# Patient Record
Sex: Female | Born: 1995 | Race: Black or African American | Hispanic: No | Marital: Single | Smoking: Never smoker
Health system: Southern US, Community
[De-identification: ages and names within clinical notes are randomized; demographics above are authoritative.]

## PROBLEM LIST (undated history)

## (undated) SURGERY — EXCISION, CHALAZION
Anesthesia: LOCAL | Laterality: Left

---
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
## 2016-05-30 ENCOUNTER — Emergency Department (HOSPITAL_COMMUNITY)
Admission: EM | Admit: 2016-05-30 | Discharge: 2016-05-30 | Disposition: A | Discharge status: Home / Self Care | Payer: Medicaid Other | Attending: Emergency Medicine | Admitting: Emergency Medicine

## 2016-05-30 ENCOUNTER — Encounter (HOSPITAL_COMMUNITY): Payer: Self-pay | Admitting: *Deleted

## 2016-05-30 ENCOUNTER — Emergency Department (HOSPITAL_COMMUNITY): Payer: Medicaid Other

## 2016-05-30 DIAGNOSIS — Y929 Unspecified place or not applicable: Secondary | ICD-10-CM | POA: Insufficient documentation

## 2016-05-30 DIAGNOSIS — Y9341 Activity, dancing: Secondary | ICD-10-CM | POA: Insufficient documentation

## 2016-05-30 DIAGNOSIS — M25562 Pain in left knee: Secondary | ICD-10-CM | POA: Insufficient documentation

## 2016-05-30 DIAGNOSIS — X501XXA Overexertion from prolonged static or awkward postures, initial encounter: Secondary | ICD-10-CM | POA: Insufficient documentation

## 2016-05-30 DIAGNOSIS — Y999 Unspecified external cause status: Secondary | ICD-10-CM | POA: Insufficient documentation

## 2016-05-30 MED ORDER — NAPROXEN 500 MG PO TABS: 500.0000 mg | ORAL_TABLET | Freq: Two times a day (BID) | ORAL | 0 refills | Status: DC

## 2016-05-30 NOTE — ED Provider Notes (Signed)
[facility]-EMERGENCY DEPT Provider Note   CSN: [id number] Arrival date & time: 05/30/16  1919   By signing my name below, I, Clarisse Gouge, attest that this documentation has been prepared under the direction and in the presence of Sharilyn Sites, PA-C. Marland Kitchen Electronically signed, Clarisse Gouge, ED Scribe. 05/30/16. 8:28 PM.   History   Chief Complaint Chief Complaint  Patient presents with  . Knee Pain   The history is provided by the patient, medical records and a parent. No language interpreter was used.    Rebecca Friedman is a 21 y.o. female no pertinent PMHx on file, transported by her mother to the Emergency Department with concern for acute onset, severe L knee pain s/p twisting it while dancing today. No fall, head trauma or LOC noted. Associated L knee swelling noted. No other modifying factors noted. She describes 10/10 pain around the knee worsened with extension and weight bearing. No h/o similar symptoms noted. Pt ambulatory with crutches. No other injuries, medication allergies or any other complaints noted at this time.   History reviewed. No pertinent past medical history.  There are no active problems to display for this patient.   Past Surgical History:  Procedure Laterality Date  . CHALAZION EXCISION Left 08/15/2012   Procedure: EXCISION CHALAZION UPPER LID LEFT EYE;  Surgeon: Vita Erm., MD;  Location: [facility];  Service: Ophthalmology;  Laterality: Left;    OB History    No data available       Home Medications    Prior to Admission medications   Not on File    Family History No family history on file.  Social History Social History  Substance Use Topics  . Smoking status: Never Smoker  . Smokeless tobacco: Never Used  . Alcohol use No     Allergies   Patient has no known allergies.   Review of Systems Review of Systems  Musculoskeletal: Positive for arthralgias, gait problem and joint swelling.  All other systems  reviewed and are negative.    Physical Exam Updated Vital Signs BP 113/74 (BP Location: Right Arm)   Pulse 78   Temp 98.8 F (37.1 C) (Oral)   Resp 20   Ht  (1.702 m)   Wt 148 lb (67.1 kg)   LMP 05/28/2016   SpO2 100%   BMI 23.18 kg/m   Physical Exam  Constitutional: She is oriented to person, place, and time. She appears well-developed and well-nourished.  HENT:  Head: Normocephalic and atraumatic.  Mouth/Throat: Oropharynx is clear and moist.  Eyes: Conjunctivae and EOM are normal. Pupils are equal, round, and reactive to light.  Neck: Normal range of motion.  Cardiovascular: Normal rate, regular rhythm and normal heart sounds.   Pulmonary/Chest: Effort normal and breath sounds normal.  Abdominal: Soft. Bowel sounds are normal.  Musculoskeletal: Normal range of motion.  Left knee with mild swelling along the medial joint line; locally tender; pain noted with flexion and when valgus stress applied; DP pulse intact; normal sensation throughout leg; no overlying skin changes  Neurological: She is alert and oriented to person, place, and time.  Skin: Skin is warm and dry.  Psychiatric: She has a normal mood and affect.  Nursing note and vitals reviewed.    ED Treatments / Results  DIAGNOSTIC STUDIES: Oxygen Saturation is 100% on RA, NL by my interpretation.    COORDINATION OF CARE: 8:26 PM-Discussed next steps with pt. Pt verbalized understanding and is agreeable with the plan. Will  order brace and prepare for d/c with F/U instructions with orthopedic specialist. Pt prepared for d/c, advised of symptomatic care at home and return precautions.    Labs (all labs ordered are listed, but only abnormal results are displayed) Labs Reviewed - No data to display  EKG  EKG Interpretation None       Radiology Dg Knee Complete 4 Views Left  Result Date: 05/30/2016 CLINICAL DATA:  Left knee pain after twisting injury. EXAM: LEFT KNEE - COMPLETE 4+ VIEW COMPARISON:   None. FINDINGS: No evidence of fracture, dislocation, or joint effusion. No evidence of arthropathy. Mild enthesopathy along the posterior aspect of the distal femoral diaphysis. Soft tissues are unremarkable. IMPRESSION: No acute osseous abnormality. Electronically Signed   By: Tollie Eth M.D.   On: 05/30/2016 20:13    Procedures Procedures (including critical care time)  Medications Ordered in ED Medications - No data to display   Initial Impression / Assessment and Plan / ED Course  I have reviewed the triage vital signs and the nursing notes.  Pertinent labs & imaging results that were available during my care of the patient were reviewed by me and considered in my medical decision making (see chart for details).  21 year old female here with left knee pain after she twisted it dancing today.  No acute deformity noted on exam, does have some swelling and tenderness along the medial joint line. She has noted pain with extension and when valgus stress was applied to the knee. Leg remains neurovascularly intact. X-ray obtained and is negative for acute findings.  Patient placed in knee sleeve. Continue using crutches for now and progress back to full weight bearing as tolerated.  Referred to orthopedics if ongoing issues.  RICE routine encouraged.  Rx naprosyn.  Discussed plan with patient, she acknowledged understanding and agreed with plan of care.  Return precautions given for new or worsening symptoms.  Final Clinical Impressions(s) / ED Diagnoses   Final diagnoses:  Acute pain of left knee    New Prescriptions Discharge Medication List as of 05/30/2016  8:44 PM    START taking these medications   Details  naproxen (NAPROSYN) 500 MG tablet Take 1 tablet (500 mg total) by mouth 2 (two) times daily with a meal., Starting Sun 05/30/2016, Print       I personally performed the services described in this documentation, which was scribed in my presence. The recorded information has been  reviewed and is accurate.   Garlon Hatchet, PA-C 05/30/16 2120    Rolland Porter, MD 06/12/16 [phone]

## 2016-05-30 NOTE — Discharge Instructions (Signed)
Take the prescribed medication as directed.  Recommend to ice and elevate knee at home to help with pain/swelling. Follow-up with Dr. Aundria Rud if you continue having ongoing issues. Return to the ED for new or worsening symptoms.

## 2016-05-30 NOTE — ED Triage Notes (Signed)
Pt arrives on crutches, states she twisted her left knee while dancing today. No meds PTA.

## 2016-05-30 NOTE — ED Notes (Signed)
Patient transported to X-ray 

## 2018-02-15 ENCOUNTER — Telehealth: Payer: Self-pay | Admitting: Family Medicine

## 2018-02-15 NOTE — Telephone Encounter (Signed)
Patient called in stated she confirmed pregnancy with a physician however no record on file. Informed the customer if she would show our clinic proof of documentation we can proceed with scheduling the appt. Stated she would come in Thursday.

## 2018-04-04 ENCOUNTER — Encounter: Payer: Self-pay | Admitting: Student

## 2018-08-01 ENCOUNTER — Emergency Department (HOSPITAL_COMMUNITY)
Admission: EM | Admit: 2018-08-01 | Discharge: 2018-08-01 | Disposition: A | Discharge status: Home / Self Care | Payer: No Typology Code available for payment source | Attending: Emergency Medicine | Admitting: Emergency Medicine

## 2018-08-01 ENCOUNTER — Encounter (HOSPITAL_COMMUNITY): Payer: Self-pay

## 2018-08-01 ENCOUNTER — Other Ambulatory Visit: Payer: Self-pay

## 2018-08-01 ENCOUNTER — Ambulatory Visit (HOSPITAL_COMMUNITY)
Admission: EM | Admit: 2018-08-01 | Discharge: 2018-08-01 | Disposition: A | Discharge status: Home / Self Care | Payer: Medicaid Other | Attending: Family Medicine | Admitting: Family Medicine

## 2018-08-01 DIAGNOSIS — M25562 Pain in left knee: Secondary | ICD-10-CM | POA: Insufficient documentation

## 2018-08-01 DIAGNOSIS — S069X9A Unspecified intracranial injury with loss of consciousness of unspecified duration, initial encounter: Secondary | ICD-10-CM

## 2018-08-01 DIAGNOSIS — M791 Myalgia, unspecified site: Secondary | ICD-10-CM | POA: Diagnosis not present

## 2018-08-01 DIAGNOSIS — M25561 Pain in right knee: Secondary | ICD-10-CM | POA: Diagnosis not present

## 2018-08-01 DIAGNOSIS — R51 Headache: Secondary | ICD-10-CM | POA: Diagnosis present

## 2018-08-01 DIAGNOSIS — S069X1A Unspecified intracranial injury with loss of consciousness of 30 minutes or less, initial encounter: Secondary | ICD-10-CM

## 2018-08-01 DIAGNOSIS — M7918 Myalgia, other site: Secondary | ICD-10-CM

## 2018-08-01 MED ORDER — CYCLOBENZAPRINE HCL 10 MG PO TABS: 10.0000 mg | ORAL_TABLET | Freq: Two times a day (BID) | ORAL | 0 refills | Status: DC | PRN
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
## 2018-08-01 NOTE — Discharge Instructions (Signed)
Go to ER
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
## 2018-08-01 NOTE — Discharge Instructions (Addendum)
Please read attached information. If you experience any new or worsening signs or symptoms please return to the emergency room for evaluation. Please follow-up with your primary care provider or specialist as discussed. Please use medication prescribed only as directed and discontinue taking if you have any concerning signs or symptoms.   °
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
## 2019-10-31 ENCOUNTER — Ambulatory Visit (INDEPENDENT_AMBULATORY_CARE_PROVIDER_SITE_OTHER): Payer: Self-pay | Admitting: *Deleted

## 2019-10-31 DIAGNOSIS — Z3480 Encounter for supervision of other normal pregnancy, unspecified trimester: Secondary | ICD-10-CM | POA: Insufficient documentation

## 2019-10-31 DIAGNOSIS — Z3481 Encounter for supervision of other normal pregnancy, first trimester: Secondary | ICD-10-CM

## 2019-10-31 MED ORDER — PRENATE PIXIE 10-0.6-0.4-200 MG PO CAPS: 1.0000 | ORAL_CAPSULE | Freq: Every day | ORAL | 11 refills | Status: DC

## 2019-10-31 NOTE — Progress Notes (Signed)
Patient was assessed and managed by nursing staff during this encounter. I have reviewed the chart and agree with the documentation and plan. I have also made any necessary editorial changes.  Warden Fillers, MD 10/31/2019 2:51 PM

## 2019-10-31 NOTE — Progress Notes (Signed)
I connected with  Rebecca Friedman on 10/31/19 by a video enabled telemedicine application and verified that I am speaking with the correct person using two identifiers.   I discussed the limitations of evaluation and management by telemedicine. The patient expressed understanding and agreed to proceed.   PRENATAL INTAKE SUMMARY  Rebecca Friedman presents today New OB Nurse Interview.  OB History   No obstetric history on file.    I have reviewed the patient's medical, obstetrical, social, and family histories, medications, and available lab results.  SUBJECTIVE She has no unusual complaints  OBJECTIVE Initial Intake Exam (New OB)  GENERAL APPEARANCE: sounds well with some congestion via televisit    ASSESSMENT Normal pregnancy  PLAN Prenatal care [facility] Advised may need Covid Test if symptoms worsen, pt is having some slight congestion.  OTC meds reviewed

## 2019-11-07 ENCOUNTER — Other Ambulatory Visit (HOSPITAL_COMMUNITY)
Admission: RE | Admit: 2019-11-07 | Discharge: 2019-11-07 | Disposition: A | Discharge status: Home / Self Care | Payer: Medicaid Other | Source: Ambulatory Visit | Attending: Obstetrics | Admitting: Obstetrics

## 2019-11-07 ENCOUNTER — Encounter: Payer: Self-pay | Admitting: Obstetrics

## 2019-11-07 ENCOUNTER — Ambulatory Visit (INDEPENDENT_AMBULATORY_CARE_PROVIDER_SITE_OTHER): Payer: Medicaid Other | Admitting: Obstetrics

## 2019-11-07 ENCOUNTER — Other Ambulatory Visit: Payer: Self-pay

## 2019-11-07 VITALS — BP 125/68 | HR 74 | Wt 159.0 lb

## 2019-11-07 DIAGNOSIS — Z3A16 16 weeks gestation of pregnancy: Secondary | ICD-10-CM | POA: Diagnosis not present

## 2019-11-07 DIAGNOSIS — Z3480 Encounter for supervision of other normal pregnancy, unspecified trimester: Secondary | ICD-10-CM | POA: Diagnosis not present

## 2019-11-07 DIAGNOSIS — Z3482 Encounter for supervision of other normal pregnancy, second trimester: Secondary | ICD-10-CM | POA: Diagnosis not present

## 2019-11-07 NOTE — Progress Notes (Signed)
Subjective:    Rebecca Friedman is being seen today for her first obstetrical visit.  This is a planned pregnancy. She is at [redacted]w[redacted]d gestation. Her obstetrical history is significant for none. Relationship with FOB: significant other, living together. Patient does intend to breast feed. Pregnancy history fully reviewed.  The information documented in the HPI was reviewed and verified.  Menstrual History: OB History    Gravida  1   Para      Term      Preterm      AB      Living        SAB      TAB      Ectopic      Multiple      Live Births               Patient's last menstrual period was 07/17/2019.    History reviewed. No pertinent past medical history.  Past Surgical History:  Procedure Laterality Date  . CHALAZION EXCISION Left 08/15/2012   Procedure: EXCISION CHALAZION UPPER LID LEFT EYE;  Surgeon: Vita Erm., MD;  Location: [facility];  Service: Ophthalmology;  Laterality: Left;    (Not in a hospital admission)  No Known Allergies  Social History   Tobacco Use  . Smoking status: Never Smoker  . Smokeless tobacco: Never Used  Substance Use Topics  . Alcohol use: No    History reviewed. No pertinent family history.   Review of Systems Constitutional: negative for weight loss Gastrointestinal: negative for vomiting Genitourinary:negative for genital lesions and vaginal discharge and dysuria Musculoskeletal:negative for back pain Behavioral/Psych: negative for abusive relationship, depression, illegal drug usage and tobacco use    Objective:    BP 125/68   Pulse 74   Wt 159 lb (72.1 kg)   LMP 07/17/2019   BMI 23.48 kg/m  General Appearance:    Alert, cooperative, no distress, appears stated age  Head:    Normocephalic, without obvious abnormality, atraumatic  Eyes:    PERRL, conjunctiva/corneas clear, EOM's intact, fundi    benign, both eyes  Ears:    Normal TM's and external ear canals, both ears  Nose:   Nares  normal, septum midline, mucosa normal, no drainage    or sinus tenderness  Throat:   Lips, mucosa, and tongue normal; teeth and gums normal  Neck:   Supple, symmetrical, trachea midline, no adenopathy;    thyroid:  no enlargement/tenderness/nodules; no carotid   bruit or JVD  Back:     Symmetric, no curvature, ROM normal, no CVA tenderness  Lungs:     Clear to auscultation bilaterally, respirations unlabored  Chest Wall:    No tenderness or deformity   Heart:    Regular rate and rhythm, S1 and S2 normal, no murmur, rub   or gallop  Breast Exam:    No tenderness, masses, or nipple abnormality  Abdomen:     Soft, non-tender, bowel sounds active all four quadrants,    no masses, no organomegaly  Genitalia:    Normal female without lesion, discharge or tenderness  Extremities:   Extremities normal, atraumatic, no cyanosis or edema  Pulses:   2+ and symmetric all extremities  Skin:   Skin color, texture, turgor normal, no rashes or lesions  Lymph nodes:   Cervical, supraclavicular, and axillary nodes normal  Neurologic:   CNII-XII intact, normal strength, sensation and reflexes    throughout      Lab Review Urine pregnancy  test Labs reviewed yes Radiologic studies reviewed no  Assessment:    Pregnancy at [redacted]w[redacted]d weeks    Plan:      Prenatal vitamins.  Counseling provided regarding continued use of seat belts, cessation of alcohol consumption, smoking or use of illicit drugs; infection precautions i.e., influenza/TDAP immunizations, toxoplasmosis,CMV, parvovirus, listeria and varicella; workplace safety, exercise during pregnancy; routine dental care, safe medications, sexual activity, hot tubs, saunas, pools, travel, caffeine use, fish and methlymercury, potential toxins, hair treatments, varicose veins Weight gain recommendations per IOM guidelines reviewed: underweight/BMI< 18.5--> gain 28 - 40 lbs; normal weight/BMI 18.5 - 24.9--> gain 25 - 35 lbs; overweight/BMI 25 - 29.9--> gain 15  - 25 lbs; obese/BMI >30->gain  11 - 20 lbs Problem list reviewed and updated. FIRST/CF mutation testing/NIPT/QUAD SCREEN/fragile X/Ashkenazi Jewish population testing/Spinal muscular atrophy discussed: requested. Role of ultrasound in pregnancy discussed; fetal survey: requested. Amniocentesis discussed: not indicated.  No orders of the defined types were placed in this encounter.  Orders Placed This Encounter  Procedures  . Culture, OB Urine  . [place] MFM OB COMP + 14 WK    Standing Status:   Future    Standing Expiration Date:   11/06/2020    Order Specific Question:   Reason for Exam (SYMPTOM  OR DIAGNOSIS REQUIRED)    Answer:   anatomy, datine    Order Specific Question:   Preferred Location    Answer:   [facility] Ultrasound  . CBC/D/Plt+RPR+Rh+ABO+Rub Ab...  . Genetic Screening    Follow up in 4 weeks. 50% of 20 min visit spent on counseling and coordination of care.    Brock Bad, MD 11/07/2019 2:21 PM

## 2019-11-08 ENCOUNTER — Other Ambulatory Visit: Payer: Self-pay | Admitting: Obstetrics

## 2019-11-08 DIAGNOSIS — N760 Acute vaginitis: Secondary | ICD-10-CM

## 2019-11-08 DIAGNOSIS — O99019 Anemia complicating pregnancy, unspecified trimester: Secondary | ICD-10-CM

## 2019-11-08 LAB — CBC/D/PLT+RPR+RH+ABO+RUB AB...
Antibody Screen: NEGATIVE
Basophils Absolute: 0 10*3/uL (ref 0.0–0.2)
Basos: 0 %
EOS (ABSOLUTE): 0.1 10*3/uL (ref 0.0–0.4)
Eos: 1 %
HCV Ab: <0.1 s/co ratio (ref 0.0–0.9)
HIV Screen 4th Generation wRfx: NONREACTIVE
Hematocrit: 34.5 % (ref 34.0–46.6)
Hemoglobin: 10.9 g/dL — ABNORMAL LOW (ref 11.1–15.9)
Hepatitis B Surface Ag: NEGATIVE
Immature Grans (Abs): 0.1 10*3/uL (ref 0.0–0.1)
Immature Granulocytes: 1 %
Lymphocytes Absolute: 1.9 10*3/uL (ref 0.7–3.1)
Lymphs: 19 %
MCH: 23.5 pg — ABNORMAL LOW (ref 26.6–33.0)
MCHC: 31.6 g/dL (ref 31.5–35.7)
MCV: 75 fL — ABNORMAL LOW (ref 79–97)
Monocytes Absolute: 0.9 10*3/uL (ref 0.1–0.9)
Monocytes: 9 %
Neutrophils Absolute: 7.2 10*3/uL — ABNORMAL HIGH (ref 1.4–7.0)
Neutrophils: 70 %
Platelets: 249 10*3/uL (ref 150–450)
RBC: 4.63 x10E6/uL (ref 3.77–5.28)
RDW: 14.8 % (ref 11.7–15.4)
RPR Ser Ql: NONREACTIVE
Rh Factor: POSITIVE
Rubella Antibodies, IGG: 5.47 index (ref >0.99)
WBC: 10.2 10*3/uL (ref 3.4–10.8)

## 2019-11-08 LAB — CERVICOVAGINAL ANCILLARY ONLY
Bacterial Vaginitis (gardnerella): POSITIVE — AB
Candida Glabrata: NEGATIVE
Candida Vaginitis: NEGATIVE
Chlamydia: NEGATIVE
Comment: NEGATIVE
Comment: NEGATIVE
Comment: NEGATIVE
Comment: NEGATIVE
Comment: NEGATIVE
Comment: NORMAL
Neisseria Gonorrhea: NEGATIVE
Trichomonas: NEGATIVE

## 2019-11-08 LAB — HCV INTERPRETATION

## 2019-11-08 MED ORDER — METRONIDAZOLE 500 MG PO TABS: 500.0000 mg | ORAL_TABLET | Freq: Two times a day (BID) | ORAL | 2 refills | Status: DC

## 2019-11-08 MED ORDER — FERROUS SULFATE 325 (65 FE) MG PO TABS: 325.0000 mg | ORAL_TABLET | Freq: Two times a day (BID) | ORAL | 5 refills | Status: AC

## 2019-11-09 LAB — CYTOLOGY - PAP: Diagnosis: NEGATIVE

## 2019-11-10 ENCOUNTER — Other Ambulatory Visit: Payer: Self-pay | Admitting: Obstetrics

## 2019-11-10 DIAGNOSIS — N3000 Acute cystitis without hematuria: Secondary | ICD-10-CM

## 2019-11-10 LAB — URINE CULTURE, OB REFLEX

## 2019-11-10 LAB — CULTURE, OB URINE

## 2019-11-10 MED ORDER — NITROFURANTOIN MONOHYD MACRO 100 MG PO CAPS: 100.0000 mg | ORAL_CAPSULE | Freq: Two times a day (BID) | ORAL | 2 refills | Status: DC

## 2019-11-12 ENCOUNTER — Encounter: Payer: Self-pay | Admitting: Obstetrics

## 2019-11-21 ENCOUNTER — Encounter: Payer: Self-pay | Admitting: Obstetrics

## 2019-11-26 ENCOUNTER — Other Ambulatory Visit: Payer: Medicaid Other

## 2019-11-27 ENCOUNTER — Ambulatory Visit: Payer: No Typology Code available for payment source

## 2019-12-05 ENCOUNTER — Encounter: Payer: Self-pay | Admitting: Obstetrics and Gynecology

## 2019-12-05 ENCOUNTER — Encounter: Payer: Medicaid Other | Admitting: Obstetrics and Gynecology

## 2019-12-05 DIAGNOSIS — O99012 Anemia complicating pregnancy, second trimester: Secondary | ICD-10-CM | POA: Insufficient documentation

## 2019-12-05 DIAGNOSIS — O2342 Unspecified infection of urinary tract in pregnancy, second trimester: Secondary | ICD-10-CM | POA: Insufficient documentation

## 2019-12-06 ENCOUNTER — Other Ambulatory Visit: Payer: Self-pay

## 2019-12-06 ENCOUNTER — Ambulatory Visit: Payer: Medicaid Other | Attending: Obstetrics

## 2019-12-06 DIAGNOSIS — Z3480 Encounter for supervision of other normal pregnancy, unspecified trimester: Secondary | ICD-10-CM | POA: Diagnosis not present

## 2019-12-10 ENCOUNTER — Encounter: Payer: Medicaid Other | Admitting: Advanced Practice Midwife

## 2019-12-20 ENCOUNTER — Other Ambulatory Visit: Payer: Self-pay

## 2019-12-20 ENCOUNTER — Encounter: Payer: Self-pay | Admitting: Obstetrics

## 2019-12-20 ENCOUNTER — Ambulatory Visit (INDEPENDENT_AMBULATORY_CARE_PROVIDER_SITE_OTHER): Payer: Medicaid Other | Admitting: Obstetrics

## 2019-12-20 VITALS — BP 123/66 | HR 76 | Wt 168.1 lb

## 2019-12-20 DIAGNOSIS — O99019 Anemia complicating pregnancy, unspecified trimester: Secondary | ICD-10-CM

## 2019-12-20 DIAGNOSIS — Z3480 Encounter for supervision of other normal pregnancy, unspecified trimester: Secondary | ICD-10-CM

## 2019-12-20 DIAGNOSIS — M549 Dorsalgia, unspecified: Secondary | ICD-10-CM

## 2019-12-20 MED ORDER — COMFORT FIT MATERNITY SUPP SM MISC: 0 refills | Status: AC

## 2019-12-20 MED ORDER — BLOOD PRESSURE KIT DEVI: 1.0000 | 0 refills | Status: AC | PRN

## 2019-12-20 NOTE — Progress Notes (Signed)
Subjective:  Rebecca Friedman is a 24 y.o. G1P0 at 38w2dbeing seen today for ongoing prenatal care.  She is currently monitored for the following issues for this low-risk pregnancy and has Supervision of other normal pregnancy, antepartum; UTI in pregnancy, antepartum, second trimester; and Anemia in pregnancy, second trimester on their problem list.  Patient reports backache and pelvic pressure.  Contractions: Not present. Vag. Bleeding: None.  Movement: Present. Denies leaking of fluid.   The following portions of the patient's history were reviewed and updated as appropriate: allergies, current medications, past family history, past medical history, past social history, past surgical history and problem list. Problem list updated.  Objective:   Vitals:   12/20/19 0921  BP: 123/66  Pulse: 76  Weight: 168 lb 1.6 oz (76.2 kg)    Fetal Status:     Movement: Present     General:  Alert, oriented and cooperative. Patient is in no acute distress.  Skin: Skin is warm and dry. No rash noted.   Cardiovascular: Normal heart rate noted  Respiratory: Normal respiratory effort, no problems with respiration noted  Abdomen: Soft, gravid, appropriate for gestational age. Pain/Pressure: Absent     Pelvic:  Cervical exam deferred        Extremities: Normal range of motion.  Edema: None  Mental Status: Normal mood and affect. Normal behavior. Normal judgment and thought content.   Urinalysis:      Assessment and Plan:  Pregnancy: G1P0 at 257w2d1. Supervision of other normal pregnancy, antepartum Rx: - Blood Pressure Monitoring (BLOOD PRESSURE KIT) DEVI; 1 kit by Does not apply route as needed.  Dispense: 1 each; Refill: 0  2. Anemia affecting pregnancy, antepartum - FeSO4 350 mg po bid  3. Backache symptom Rx: - Elastic Bandages & Supports (COMFORT FIT MATERNITY SUPP SM) MISC; Wear as directed.  Dispense: 1 each; Refill: 0   Preterm labor symptoms and general obstetric precautions  including but not limited to vaginal bleeding, contractions, leaking of fluid and fetal movement were reviewed in detail with the patient. Please refer to After Visit Summary for other counseling recommendations.   Return in about 4 weeks (around 01/17/2020) for MyChart.   HaShelly BombardMD  12/20/19

## 2019-12-20 NOTE — Progress Notes (Signed)
Patient reports fetal movement, denies pain. 

## 2020-01-17 ENCOUNTER — Encounter: Payer: Self-pay | Admitting: Obstetrics

## 2020-01-17 ENCOUNTER — Telehealth (INDEPENDENT_AMBULATORY_CARE_PROVIDER_SITE_OTHER): Payer: Medicaid Other | Admitting: Obstetrics

## 2020-01-17 DIAGNOSIS — Z3A26 26 weeks gestation of pregnancy: Secondary | ICD-10-CM

## 2020-01-17 DIAGNOSIS — O99012 Anemia complicating pregnancy, second trimester: Secondary | ICD-10-CM

## 2020-01-17 DIAGNOSIS — M549 Dorsalgia, unspecified: Secondary | ICD-10-CM

## 2020-01-17 DIAGNOSIS — Z3480 Encounter for supervision of other normal pregnancy, unspecified trimester: Secondary | ICD-10-CM

## 2020-01-17 DIAGNOSIS — D649 Anemia, unspecified: Secondary | ICD-10-CM

## 2020-01-17 DIAGNOSIS — O99019 Anemia complicating pregnancy, unspecified trimester: Secondary | ICD-10-CM

## 2020-01-17 DIAGNOSIS — O26892 Other specified pregnancy related conditions, second trimester: Secondary | ICD-10-CM

## 2020-01-17 MED ORDER — PRENATE MINI 29-0.6-0.4-350 MG PO CAPS
1.0000 | ORAL_CAPSULE | Freq: Every day | ORAL | 3 refills | Status: DC
Start: 2020-01-17 — End: 2020-01-17

## 2020-01-17 MED ORDER — FERROUS SULFATE 325 (65 FE) MG PO TABS: 325.0000 mg | ORAL_TABLET | Freq: Two times a day (BID) | ORAL | 5 refills | Status: DC

## 2020-01-17 MED ORDER — PRENATE PIXIE 10-0.6-0.4-200 MG PO CAPS: 1.0000 | ORAL_CAPSULE | Freq: Every day | ORAL | 11 refills | Status: AC

## 2020-01-17 MED ORDER — COMFORT FIT MATERNITY SUPP SM MISC: 0 refills | Status: DC

## 2020-01-17 NOTE — Progress Notes (Signed)
   OBSTETRICS PRENATAL VIRTUAL VISIT ENCOUNTER NOTE  Provider location: [facility] for [facility] [place]   I connected with Anthony Sar on 01/17/20 at  9:00 AM EST by MyChart Video Encounter at home and verified that I am speaking with the correct person using two identifiers.   I discussed the limitations, risks, security and privacy concerns of performing an evaluation and management service virtually and the availability of in person appointments. I also discussed with the patient that there may be a patient responsible charge related to this service. The patient expressed understanding and agreed to proceed.  Subjective:  Rebecca Friedman is a 24 y.o. G1P0 at [redacted]w[redacted]d being seen today for ongoing prenatal care.  She is currently monitored for the following issues for this low-risk pregnancy and has Supervision of other normal pregnancy, antepartum; UTI in pregnancy, antepartum, second trimester; and Anemia in pregnancy, second trimester on their problem list.  Patient reports backache.  Contractions: Not present. Vag. Bleeding: None.  Movement: Present. Denies any leaking of fluid.   The following portions of the patient's history were reviewed and updated as appropriate: allergies, current medications, past family history, past medical history, past social history, past surgical history and problem list.   Objective:  There were no vitals filed for this visit.  Fetal Status:     Movement: Present     General:  Alert, oriented and cooperative. Patient is in no acute distress.  Respiratory: Normal respiratory effort, no problems with respiration noted  Mental Status: Normal mood and affect. Normal behavior. Normal judgment and thought content.  Rest of physical exam deferred due to type of encounter  Imaging: No results found.  Assessment and Plan:  Pregnancy: G1P0 at [redacted]w[redacted]d  1. Supervision of other normal pregnancy, antepartum Rx: - Prenat-FeAsp-Meth-FA-DHA w/o A (PRENATE  PIXIE) 10-0.6-0.4-200 MG CAPS; Take 1 capsule by mouth daily.  Dispense: 30 capsule; Refill: 11  2. Anemia affecting pregnancy, antepartum Rx: - ferrous sulfate 325 (65 FE) MG tablet; Take 1 tablet (325 mg total) by mouth 2 (two) times daily with a meal.  Dispense: 60 tablet; Refill: 5  3. Backache symptom Rx: - Elastic Bandages & Supports (COMFORT FIT MATERNITY SUPP SM) MISC; Wear as directed.  Dispense: 1 each; Refill: 0   Preterm labor symptoms and general obstetric precautions including but not limited to vaginal bleeding, contractions, leaking of fluid and fetal movement were reviewed in detail with the patient. I discussed the assessment and treatment plan with the patient. The patient was provided an opportunity to ask questions and all were answered. The patient agreed with the plan and demonstrated an understanding of the instructions. The patient was advised to call back or seek an in-person office evaluation/go to MAU at [facility] for any urgent or concerning symptoms. Please refer to After Visit Summary for other counseling recommendations.   I provided 20 minutes of face-to-face time during this encounter.  Return in about 2 weeks (around 01/31/2020) for ROB, 2 hour OGTT.    Coral Ceo, MD Center for [facility], [facility] 01/17/20

## 2020-01-17 NOTE — Progress Notes (Signed)
Pt does not have BP cuff for today's visit.

## 2020-01-31 ENCOUNTER — Encounter: Payer: Medicaid Other | Admitting: Obstetrics

## 2020-01-31 ENCOUNTER — Other Ambulatory Visit: Payer: Medicaid Other

## 2020-02-06 ENCOUNTER — Other Ambulatory Visit: Payer: Medicaid Other

## 2020-02-06 ENCOUNTER — Other Ambulatory Visit: Payer: Self-pay

## 2020-02-06 DIAGNOSIS — Z3480 Encounter for supervision of other normal pregnancy, unspecified trimester: Secondary | ICD-10-CM

## 2020-02-07 LAB — CBC
Hematocrit: 35.8 % (ref 34.0–46.6)
Hemoglobin: 11.4 g/dL (ref 11.1–15.9)
MCH: 24.3 pg — ABNORMAL LOW (ref 26.6–33.0)
MCHC: 31.8 g/dL (ref 31.5–35.7)
MCV: 76 fL — ABNORMAL LOW (ref 79–97)
Platelets: 223 10*3/uL (ref 150–450)
RBC: 4.7 x10E6/uL (ref 3.77–5.28)
RDW: 13.7 % (ref 11.7–15.4)
WBC: 8.7 10*3/uL (ref 3.4–10.8)

## 2020-02-07 LAB — HIV ANTIBODY (ROUTINE TESTING W REFLEX): HIV Screen 4th Generation wRfx: NONREACTIVE

## 2020-02-07 LAB — RPR: RPR Ser Ql: NONREACTIVE

## 2020-02-07 LAB — GLUCOSE TOLERANCE, 2 HOURS W/ 1HR
Glucose, 1 hour: 124 mg/dL (ref 65–179)
Glucose, 2 hour: 107 mg/dL (ref 65–152)
Glucose, Fasting: 79 mg/dL (ref 65–91)

## 2020-02-09 NOTE — L&D Delivery Note (Signed)
Delivery Note At 4:48 AM a viable female was delivered via Vaginal, Spontaneous (Presentation: Left Occiput Anterior).  APGAR: 9, 9; weight pending. After 1 minute, the cord was clamped and cut. 40 units of pitocin diluted in 1000cc LR was infused rapidly IV.  The placenta separated spontaneously and delivered via CCT and maternal pushing effort.  It was inspected and appears to be intact with a 3 VC.   Anesthesia: Epidural Episiotomy: None Lacerations: 2nd degree Suture Repair: 2.0 vicryl Est. Blood Loss (mL): 200  Mom to postpartum.  Baby to Couplet care / Skin to Skin.  Rebecca Friedman 04/11/2020, 5:25 AM

## 2020-02-20 ENCOUNTER — Telehealth (INDEPENDENT_AMBULATORY_CARE_PROVIDER_SITE_OTHER): Payer: Medicaid Other | Admitting: Women's Health

## 2020-02-20 VITALS — BP 114/67 | HR 78

## 2020-02-20 DIAGNOSIS — Z3A31 31 weeks gestation of pregnancy: Secondary | ICD-10-CM

## 2020-02-20 DIAGNOSIS — O2342 Unspecified infection of urinary tract in pregnancy, second trimester: Secondary | ICD-10-CM

## 2020-02-20 DIAGNOSIS — D563 Thalassemia minor: Secondary | ICD-10-CM

## 2020-02-20 DIAGNOSIS — Z3480 Encounter for supervision of other normal pregnancy, unspecified trimester: Secondary | ICD-10-CM

## 2020-02-20 DIAGNOSIS — O2343 Unspecified infection of urinary tract in pregnancy, third trimester: Secondary | ICD-10-CM

## 2020-02-20 DIAGNOSIS — O99012 Anemia complicating pregnancy, second trimester: Secondary | ICD-10-CM

## 2020-02-20 DIAGNOSIS — O99013 Anemia complicating pregnancy, third trimester: Secondary | ICD-10-CM

## 2020-02-20 NOTE — Progress Notes (Signed)
I connected with Rebecca Friedman 02/20/20 at  3:40 PM EST by: MyChart video and verified that I am speaking with the correct person using two identifiers.  Patient is located at home and provider is located at [facility].     The purpose of this virtual visit is to provide medical care while limiting exposure to the novel coronavirus. I discussed the limitations, risks, security and privacy concerns of performing an evaluation and management service by MyChart video and the availability of in person appointments. I also discussed with the patient that there may be a patient responsible charge related to this service. By engaging in this virtual visit, you consent to the provision of healthcare.  Additionally, you authorize for your insurance to be billed for the services provided during this visit.  The patient expressed understanding and agreed to proceed.  The following staff members participated in the virtual visit:  Donia Ast    PRENATAL VISIT NOTE  Subjective:  Rebecca Friedman is a 25 y.o. G1P0 at [redacted]w[redacted]d  for phone visit for ongoing prenatal care.  She is currently monitored for the following issues for this low-risk pregnancy and has Supervision of other normal pregnancy, antepartum; UTI in pregnancy, antepartum, second trimester; Anemia in pregnancy, second trimester; and Alpha thalassemia silent carrier on their problem list.  Patient reports no complaints.  Contractions: Not present. Vag. Bleeding: None.  Movement: Present. Denies leaking of fluid.   The following portions of the patient's history were reviewed and updated as appropriate: allergies, current medications, past family history, past medical history, past social history, past surgical history and problem list.   Objective:   Vitals:   02/20/20 1605  BP: 114/67  Pulse: 78   Self-Obtained  Fetal Status:     Movement: Present     Assessment and Plan:  Pregnancy: G1P0 at [redacted]w[redacted]d  1. Supervision of other normal  pregnancy, antepartum -peds list given  2. UTI in pregnancy, antepartum, second trimester -needs TOC next visit  3. Anemia in pregnancy, second trimester -on oral iron CBC Latest Ref Rng & Units 02/06/2020 11/07/2019  WBC 3.4 - 10.8 x10E3/uL 8.7 10.2  Hemoglobin 11.1 - 15.9 g/dL 75.6 10.9(L)  Hematocrit 34.0 - 46.6 % 35.8 34.5  Platelets 150 - 450 x10E3/uL 223 249   4. Alpha thalassemia silent carrier Patient given option at anatomy scan to meet with genetic counselor and declined, but would call back for appointment if desiring counseling.  Preterm labor symptoms and general obstetric precautions including but not limited to vaginal bleeding, contractions, leaking of fluid and fetal movement were reviewed in detail with the patient. I discussed the assessment and treatment plan with the patient. The patient was provided an opportunity to ask questions and all were answered. The patient agreed with the plan and demonstrated an understanding of the instructions. The patient was advised to call back or seek an in-person office evaluation/go to MAU at [facility] for any urgent or concerning symptoms.  Return in about 2 weeks (around 03/05/2020) for in-person LOB/APP OK/Urine TOC/Tdap.  No future appointments.  Time spent on virtual visit 5 minutes.   Marylen Ponto, NP

## 2020-02-20 NOTE — Progress Notes (Signed)
Patient presents for mychart ROB. Patient identified with 2 patient identifiers. BP today is 114/67. Patient has no concerns today.

## 2020-02-20 NOTE — Patient Instructions (Addendum)
Maternity Assessment Unit (MAU)  The Maternity Assessment Unit (MAU) is located at the [facility] and [place] [facility]. The address is: [address], [place], [place], [place] [address]. Please see map below for additional directions.    The Maternity Assessment Unit is designed to help you during your pregnancy, and for up to 6 weeks after delivery, with any pregnancy- or postpartum-related emergencies, if you think you are in labor, or if your water has broken. For example, if you experience nausea and vomiting, vaginal bleeding, severe abdominal or pelvic pain, elevated blood pressure or other problems related to your pregnancy or postpartum time, please come to the Maternity Assessment Unit for assistance.        Preterm Labor The normal length of a pregnancy is 39-41 weeks. Preterm labor is when labor starts before 37 completed weeks of pregnancy. Babies who are born prematurely and survive may not be fully developed and may be at an increased risk for long-term problems such as cerebral palsy, developmental delays, and vision and hearing problems. Babies who are born too early may have problems soon after birth. Problems may include regulating blood sugar, body temperature, heart rate, and breathing rate. These babies often have trouble with feeding. The risk of having problems is highest for babies who are born before 44 weeks of pregnancy. What are the causes? The exact cause of this condition is not known. What increases the risk? You are more likely to have preterm labor if you have certain risk factors that relate to your medical history, problems with present and past pregnancies, and lifestyle factors. Medical history  You have abnormalities of the uterus, including a short cervix.  You have STIs (sexually transmitted infections), or other infections of the urinary tract and the vagina.  You have chronic illnesses, such as blood clotting problems,  diabetes, or high blood pressure.  You are overweight or underweight. Present and past pregnancies  You have had preterm labor before.  You are pregnant with twins or other multiples.  You have been diagnosed with a condition in which the placenta covers your cervix (placenta previa).  You waited less than 6 months between giving birth and becoming pregnant again.  Your unborn baby has some abnormalities.  You have vaginal bleeding during pregnancy.  You became pregnant through in vitro fertilization (IVF). Lifestyle and environmental factors  You use tobacco products.  You drink alcohol.  You use street drugs.  You have stress and no social support.  You experience domestic violence.  You are exposed to certain chemicals or environmental pollutants. Other factors  You are younger than age 110 or older than age 19. What are the signs or symptoms? Symptoms of this condition include:  Cramps similar to those that can happen during a menstrual period. The cramps may happen with diarrhea.  Pain in the abdomen or lower back.  Regular contractions that may feel like tightening of the abdomen.  A feeling of increased pressure in the pelvis.  Increased watery or bloody mucus discharge from the vagina.  Water breaking (ruptured amniotic sac). How is this diagnosed? This condition is diagnosed based on:  Your medical history and a physical exam.  A pelvic exam.  An ultrasound.  Monitoring your uterus for contractions.  Other tests, including: ? A swab of the cervix to check for a chemical called fetal fibronectin. ? Urine tests. How is this treated? Treatment for this condition depends on the length of your pregnancy, your  condition, and the health of your baby. Treatment may include:  Taking medicines, such as: ? Hormone medicines. These may be given early in pregnancy to help support the pregnancy. ? Medicines to stop contractions. ? Medicines to help mature  the baby's lungs. These may be prescribed if the risk of delivery is high. ? Medicines to prevent your baby from developing cerebral palsy.  Bed rest. If the labor happens before 34 weeks of pregnancy, you may need to stay in the hospital.  Delivery of the baby. Follow these instructions at home:  Do not use any products that contain nicotine or tobacco, such as cigarettes, e-cigarettes, and chewing tobacco. If you need help quitting, ask your health care provider.  Do not drink alcohol.  Take over-the-counter and prescription medicines only as told by your health care provider.  Rest as told by your health care provider.  Return to your normal activities as told by your health care provider. Ask your health care provider what activities are safe for you.  Keep all follow-up visits as told by your health care provider. This is important.   How is this prevented? To increase your chance of having a full-term pregnancy:  Do not use street drugs or medicines that have not been prescribed to you during your pregnancy.  Talk with your health care provider before taking any herbal supplements, even if you have been taking them regularly.  Make sure you gain a healthy amount of weight during your pregnancy.  Watch for infection. If you think that you might have an infection, get it checked right away. Symptoms of infection may include: ? Fever. ? Abnormal vaginal discharge or discharge that smells bad. ? Pain or burning with urination. ? Needing to urinate urgently. ? Frequently urinating or passing small amounts of urine frequently. ? Blood in your urine. ? Urine that smells bad or unusual.  Tell your health care provider if you have had preterm labor before. Contact a health care provider if:  You think you are going into preterm labor.  You have signs or symptoms of preterm labor.  You have symptoms of infection. Get help right away if:  You are having regular, painful  contractions every 5 minutes or less.  Your water breaks. Summary  Preterm labor is labor that starts before you reach 37 weeks of pregnancy.  Delivering your baby early increases your baby's risk of developing lifelong problems.  The exact cause of preterm labor is unknown. However, having an abnormal uterus, an STI (sexually transmitted infection), or vaginal bleeding during pregnancy increases your risk for preterm labor.  Keep all follow-up visits as told by your health care provider. This is important.  Contact a health care provider if you have signs or symptoms of preterm labor. This information is not intended to replace advice given to you by your health care provider. Make sure you discuss any questions you have with your health care provider. Document Revised: 02/27/2019 Document Reviewed: 02/27/2019 Elsevier Patient Education  2021 Elsevier Inc.        [email or web address]">  Third Trimester of Pregnancy  The third trimester of pregnancy is from week 28 through week 40. This is months 7 through 9. The third trimester is a time when the unborn baby (fetus) is growing rapidly. At the end of the ninth month, the fetus is about 20 inches long and weighs 6-10 pounds. Body changes during your third trimester During the third trimester, your body will continue to go through many  changes. The changes vary and generally return to normal after your baby is born. Physical changes  Your weight will continue to increase. You can expect to gain 25-35 pounds (11-16 kg) by the end of the pregnancy if you begin pregnancy at a normal weight. If you are underweight, you can expect to gain 28-40 lb (about 13-18 kg), and if you are overweight, you can expect to gain 15-25 lb (about 7-11 kg).  You may begin to get stretch marks on your hips, abdomen, and breasts.  Your breasts will continue to grow and may hurt. A yellow fluid  (colostrum) may leak from your breasts. This is the first milk you are producing for your baby.  You may have changes in your hair. These can include thickening of your hair, rapid growth, and changes in texture. Some people also have hair loss during or after pregnancy, or hair that feels dry or thin.  Your belly button may stick out.  You may notice more swelling in your hands, face, or ankles. Health changes  You may have heartburn.  You may have constipation.  You may develop hemorrhoids.  You may develop swollen, bulging veins (varicose veins) in your legs.  You may have increased body aches in the pelvis, back, or thighs. This is due to weight gain and increased hormones that are relaxing your joints.  You may have increased tingling or numbness in your hands, arms, and legs. The skin on your abdomen may also feel numb.  You may feel short of breath because of your expanding uterus. Other changes  You may urinate more often because the fetus is moving lower into your pelvis and pressing on your bladder.  You may have more problems sleeping. This may be caused by the size of your abdomen, an increased need to urinate, and an increase in your body's metabolism.  You may notice the fetus "dropping," or moving lower in your abdomen (lightening).  You may have increased vaginal discharge.  You may notice that you have pain around your pelvic bone as your uterus distends. Follow these instructions at home: Medicines  Follow your health care provider's instructions regarding medicine use. Specific medicines may be either safe or unsafe to take during pregnancy. Do not take any medicines unless approved by your health care provider.  Take a prenatal vitamin that contains at least 600 micrograms (mcg) of folic acid. Eating and drinking  Eat a healthy diet that includes fresh fruits and vegetables, whole grains, good sources of protein such as meat, eggs, or tofu, and low-fat  dairy products.  Avoid raw meat and unpasteurized juice, milk, and cheese. These carry germs that can harm you and your baby.  Eat 4 or 5 small meals rather than 3 large meals a day.  You may need to take these actions to prevent or treat constipation: ? Drink enough fluid to keep your urine pale yellow. ? Eat foods that are high in fiber, such as beans, whole grains, and fresh fruits and vegetables. ? Limit foods that are high in fat and processed sugars, such as fried or sweet foods. Activity  Exercise only as directed by your health care provider. Most people can continue their usual exercise routine during pregnancy. Try to exercise for 30 minutes at least 5 days a week. Stop exercising if you experience contractions in the uterus.  Stop exercising if you develop pain or cramping in the lower abdomen or lower back.  Avoid heavy lifting.  Do not exercise  if it is very hot or humid or if you are at a high altitude.  If you choose to, you may continue to have sex unless your health care provider tells you not to. Relieving pain and discomfort  Take frequent breaks and rest with your legs raised (elevated) if you have leg cramps or low back pain.  Take warm sitz baths to soothe any pain or discomfort caused by hemorrhoids. Use hemorrhoid cream if your health care provider approves.  Wear a supportive bra to prevent discomfort from breast tenderness.  If you develop varicose veins: ? Wear support hose as told by your health care provider. ? Elevate your feet for 15 minutes, 3-4 times a day. ? Limit salt in your diet. Safety  Talk to your health care provider before traveling far distances.  Do not use hot tubs, steam rooms, or saunas.  Wear your seat belt at all times when driving or riding in a car.  Talk with your health care provider if someone is verbally or physically abusive to you. Preparing for birth To prepare for the arrival of your baby:  Take prenatal classes  to understand, practice, and ask questions about labor and delivery.  Visit the hospital and tour the maternity area.  Purchase a rear-facing car seat and make sure you know how to install it in your car.  Prepare the baby's room or sleeping area. Make sure to remove all pillows and stuffed animals from the baby's crib to prevent suffocation. General instructions  Avoid cat litter boxes and soil used by cats. These carry germs that can cause birth defects in the baby. If you have a cat, ask someone to clean the litter box for you.  Do not douche or use tampons. Do not use scented sanitary pads.  Do not use any products that contain nicotine or tobacco, such as cigarettes, e-cigarettes, and chewing tobacco. If you need help quitting, ask your health care provider.  Do not use any herbal remedies, illegal drugs, or medicines that were not prescribed to you. Chemicals in these products can harm your baby.  Do not drink alcohol.  You will have more frequent prenatal exams during the third trimester. During a routine prenatal visit, your health care provider will do a physical exam, perform tests, and discuss your overall health. Keep all follow-up visits. This is important. Where to find more information  American Pregnancy Association: americanpregnancy.org  [employer]necologists: https://www.todd-brady.net/  Office on [employer] Health: MightyReward.co.nz Contact a health care provider if you have:  A fever.  Mild pelvic cramps, pelvic pressure, or nagging pain in your abdominal area or lower back.  Vomiting or diarrhea.  Bad-smelling vaginal discharge or foul-smelling urine.  Pain when you urinate.  A headache that does not go away when you take medicine.  Visual changes or see spots in front of your eyes. Get help right away if:  Your water breaks.  You have regular contractions less than 5 minutes apart.  You have spotting  or bleeding from your vagina.  You have severe abdominal pain.  You have difficulty breathing.  You have chest pain.  You have fainting spells.  You have not felt your baby move for the time period told by your health care provider.  You have new or increased pain, swelling, or redness in an arm or leg. Summary  The third trimester of pregnancy is from week 28 through week 40 (months 7 through 9).  You may have  more problems sleeping. This can be caused by the size of your abdomen, an increased need to urinate, and an increase in your body's metabolism.  You will have more frequent prenatal exams during the third trimester. Keep all follow-up visits. This is important. This information is not intended to replace advice given to you by your health care provider. Make sure you discuss any questions you have with your health care provider. Document Revised: 07/04/2019 Document Reviewed: 05/10/2019 Elsevier Patient Education  2021 Elsevier Inc.       AREA PEDIATRIC/FAMILY PRACTICE PHYSICIANS  [facility] [address] [place], [place] [address] Phone - [phone]   Fax - [phone]  [facility] AMOS [address] [place], [place]  [address] Phone - [phone]   Fax - [phone]  [facility] [address] [place], [place]  [address] Phone - [phone]   Fax - [phone]  [facility] [address] [place], [place]  [address] Phone - [phone]   Fax - [phone]  [facility] [address] [place], [place]  [address] Phone - [phone]   Fax - [phone]  [facility] [address] [place], [place]  [address] Phone - [phone]   Fax - [phone]  [facility] [place] [address] [place], [place]  [address] Phone - [phone]   Fax - [phone]  [facility] [address] [place], [address] [place], [place]  [address] Phone - [phone]   Fax - [phone]  [facility] AT [facility] [address] [place], [place]  [address] Phone - [phone]   Fax - [phone] [facility] [place] [address] [place], [place]  [address] Phone - [phone]   Fax - [phone]  [facility] [address] [place], [place]  [address] Phone - [phone]   Fax - [phone]  [facility] [address] [place], [place]  [address] Phone - [phone]   Fax - [phone]  [facility] [place] [address] [place], [place]  [address] Phone - [phone]   Fax - [phone]  [facility] [address], [address] [place], [place]  [address] Phone - [phone]  [facility] [address] [place], [place]  [address] Phone - [phone]   Fax - [phone]  [facility] [address] [place], [place]  [address] Phone - [phone]   Fax - [phone]  [facility] [address] [place], [place]  [address] Phone - [phone]   Fax - [phone]  [facility] [address] [place], [place]  [address] Phone - [phone]   Fax - [phone]   [facility] [address] [place], [place]  [address] Phone - [phone]   Fax - [phone]  [facility] [address] [place], [place]  [address] Phone - [phone]   Fax - [phone]  DAVID RUBIN [address] [place], [place]  [address] Phone - [phone]   Fax - [phone]  [facility] [address] [place], [place]  [address] Phone - [phone]   Fax - [phone]  [place] [facility] Alita Chyle [address] [place], [place]  [address] Phone - [phone]   Fax - [phone] Gerarda Fraction [address] [place],  [place]  [address] Phone - [phone]   Fax - [phone]  [facility] [address] [place], [place]  [address] Phone - [phone]   Fax - [phone]  [facility] [place] [address] [place], [place]  [address] Phone - [phone]   Fax - [phone]

## 2020-03-05 ENCOUNTER — Ambulatory Visit (INDEPENDENT_AMBULATORY_CARE_PROVIDER_SITE_OTHER): Payer: Medicaid Other | Admitting: Certified Nurse Midwife

## 2020-03-05 ENCOUNTER — Encounter: Payer: Self-pay | Admitting: Certified Nurse Midwife

## 2020-03-05 ENCOUNTER — Other Ambulatory Visit: Payer: Self-pay

## 2020-03-05 VITALS — BP 110/70 | HR 77 | Wt 176.6 lb

## 2020-03-05 DIAGNOSIS — Z3480 Encounter for supervision of other normal pregnancy, unspecified trimester: Secondary | ICD-10-CM

## 2020-03-05 DIAGNOSIS — Z3A33 33 weeks gestation of pregnancy: Secondary | ICD-10-CM

## 2020-03-05 DIAGNOSIS — O2342 Unspecified infection of urinary tract in pregnancy, second trimester: Secondary | ICD-10-CM

## 2020-03-05 NOTE — Progress Notes (Signed)
ROB 33w   2hr was WNL   Per notes need TOC pt advised to leave sample.   T-Dap: handout provided pt will decide.    CC:  None

## 2020-03-05 NOTE — Progress Notes (Signed)
Subjective:  Rebecca Friedman is a 25 y.o. G1P0 at [redacted]w[redacted]d being seen today for ongoing prenatal care.  She is currently monitored for the following issues for this low-risk pregnancy and has Supervision of other normal pregnancy, antepartum; UTI in pregnancy, antepartum, second trimester; Anemia in pregnancy, second trimester; and Alpha thalassemia silent carrier on their problem list.  Patient reports no complaints.  Contractions: Irritability. Vag. Bleeding: None.  Movement: Present. Denies leaking of fluid.   The following portions of the patient's history were reviewed and updated as appropriate: allergies, current medications, past family history, past medical history, past social history, past surgical history and problem list. Problem list updated.  Objective:   Vitals:   03/05/20 1356  BP: 110/70  Pulse: 77  Weight: 176 lb 9.6 oz (80.1 kg)    Fetal Status: Fetal Heart Rate (bpm): 142 Fundal Height: 33 cm Movement: Present  Presentation: Vertex  General:  Alert, oriented and cooperative. Patient is in no acute distress.  Skin: Skin is warm and dry. No rash noted.   Cardiovascular: Normal heart rate noted  Respiratory: Normal respiratory effort, no problems with respiration noted  Abdomen: Soft, gravid, appropriate for gestational age. Pain/Pressure: Absent     Pelvic: Vag. Bleeding: None     Cervical exam deferred        Extremities: Normal range of motion.  Edema: None  Mental Status: Normal mood and affect. Normal behavior. Normal judgment and thought content.   Urinalysis:      Assessment and Plan:  Pregnancy: G1P0 at [redacted]w[redacted]d  1. Supervision of other normal pregnancy, antepartum  2. UTI in pregnancy, antepartum, second trimester - completed abx - TOC today  3. [redacted] weeks gestation of pregnancy   Preterm labor symptoms and general obstetric precautions including but not limited to vaginal bleeding, contractions, leaking of fluid and fetal movement were reviewed in detail  with the patient. Please refer to After Visit Summary for other counseling recommendations.  Return in about 3 weeks (around 03/26/2020).   Donette Larry, CNM

## 2020-03-10 LAB — URINE CULTURE, OB REFLEX

## 2020-03-10 LAB — CULTURE, OB URINE

## 2020-03-28 ENCOUNTER — Other Ambulatory Visit: Payer: Self-pay

## 2020-03-28 ENCOUNTER — Other Ambulatory Visit (HOSPITAL_COMMUNITY)
Admission: RE | Admit: 2020-03-28 | Discharge: 2020-03-28 | Disposition: A | Discharge status: Home / Self Care | Payer: Medicaid Other | Source: Ambulatory Visit | Attending: Obstetrics and Gynecology | Admitting: Obstetrics and Gynecology

## 2020-03-28 ENCOUNTER — Ambulatory Visit (INDEPENDENT_AMBULATORY_CARE_PROVIDER_SITE_OTHER): Payer: Medicaid Other | Admitting: Obstetrics and Gynecology

## 2020-03-28 ENCOUNTER — Encounter: Payer: Self-pay | Admitting: Obstetrics and Gynecology

## 2020-03-28 VITALS — BP 123/81 | HR 71 | Wt 178.0 lb

## 2020-03-28 DIAGNOSIS — Z3A36 36 weeks gestation of pregnancy: Secondary | ICD-10-CM

## 2020-03-28 DIAGNOSIS — Z3689 Encounter for other specified antenatal screening: Secondary | ICD-10-CM | POA: Diagnosis not present

## 2020-03-28 DIAGNOSIS — Z3480 Encounter for supervision of other normal pregnancy, unspecified trimester: Secondary | ICD-10-CM | POA: Insufficient documentation

## 2020-03-28 DIAGNOSIS — D563 Thalassemia minor: Secondary | ICD-10-CM

## 2020-03-28 NOTE — Progress Notes (Signed)
ROB 36ws3d  GBS today   Declines cervix check   CC: None

## 2020-03-28 NOTE — Patient Instructions (Signed)

## 2020-03-28 NOTE — Progress Notes (Signed)
   PRENATAL VISIT NOTE  Subjective:  Rebecca Friedman is a 25 y.o. G1P0 at [redacted]w[redacted]d being seen today for ongoing prenatal care.  She is currently monitored for the following issues for this low-risk pregnancy and has Supervision of other normal pregnancy, antepartum; UTI in pregnancy, antepartum, second trimester; Anemia in pregnancy, second trimester; Alpha thalassemia silent carrier; and [redacted] weeks gestation of pregnancy on their problem list.  Patient doing well with no acute concerns today. She reports no complaints.  Contractions: Not present. Vag. Bleeding: None.  Movement: Present. Denies leaking of fluid.   The following portions of the patient's history were reviewed and updated as appropriate: allergies, current medications, past family history, past medical history, past social history, past surgical history and problem list. Problem list updated.  Objective:   Vitals:   03/28/20 0928  BP: 123/81  Pulse: 71  Weight: 178 lb (80.7 kg)    Fetal Status: Fetal Heart Rate (bpm): 150 Fundal Height: 36 cm Movement: Present     General:  Alert, oriented and cooperative. Patient is in no acute distress.  Skin: Skin is warm and dry. No rash noted.   Cardiovascular: Normal heart rate noted  Respiratory: Normal respiratory effort, no problems with respiration noted  Abdomen: Soft, gravid, appropriate for gestational age.  Pain/Pressure: Absent     Pelvic: Cervical exam performed Dilation: Closed Effacement (%): 40 Station: -3  Extremities: Normal range of motion.  Edema: None  Mental Status:  Normal mood and affect. Normal behavior. Normal judgment and thought content.   Assessment and Plan:  Pregnancy: G1P0 at [redacted]w[redacted]d  1. Supervision of other normal pregnancy, antepartum Bedside u/s verified vertex - Strep Gp B NAA - Cervicovaginal ancillary only( [place])  2. Alpha thalassemia silent carrier   3. [redacted] weeks gestation of pregnancy   Term labor symptoms and general obstetric  precautions including but not limited to vaginal bleeding, contractions, leaking of fluid and fetal movement were reviewed in detail with the patient.  Please refer to After Visit Summary for other counseling recommendations.   Return in about 1 week (around 04/04/2020) for ROB, virtual.   Mariel Aloe, MD Faculty Attending Center for [facility]

## 2020-03-30 LAB — STREP GP B NAA: Strep Gp B NAA: POSITIVE — AB

## 2020-03-31 LAB — CERVICOVAGINAL ANCILLARY ONLY
Chlamydia: NEGATIVE
Comment: NEGATIVE
Comment: NORMAL
Neisseria Gonorrhea: NEGATIVE

## 2020-04-04 ENCOUNTER — Telehealth (INDEPENDENT_AMBULATORY_CARE_PROVIDER_SITE_OTHER): Payer: Medicaid Other | Admitting: Family Medicine

## 2020-04-04 ENCOUNTER — Encounter: Payer: Self-pay | Admitting: Family Medicine

## 2020-04-04 VITALS — BP 128/80 | HR 85 | Wt 184.0 lb

## 2020-04-04 DIAGNOSIS — O99013 Anemia complicating pregnancy, third trimester: Secondary | ICD-10-CM

## 2020-04-04 DIAGNOSIS — Z3480 Encounter for supervision of other normal pregnancy, unspecified trimester: Secondary | ICD-10-CM

## 2020-04-04 DIAGNOSIS — O2343 Unspecified infection of urinary tract in pregnancy, third trimester: Secondary | ICD-10-CM

## 2020-04-04 DIAGNOSIS — D563 Thalassemia minor: Secondary | ICD-10-CM

## 2020-04-04 DIAGNOSIS — Z3A37 37 weeks gestation of pregnancy: Secondary | ICD-10-CM

## 2020-04-04 DIAGNOSIS — O2342 Unspecified infection of urinary tract in pregnancy, second trimester: Secondary | ICD-10-CM

## 2020-04-04 DIAGNOSIS — O99012 Anemia complicating pregnancy, second trimester: Secondary | ICD-10-CM

## 2020-04-04 NOTE — Progress Notes (Signed)
+   Fetal movement. No complaints.  

## 2020-04-04 NOTE — Progress Notes (Signed)
I connected with Rebecca Friedman 04/04/20 at 11:00 AM EST by: MyChart video and verified that I am speaking with the correct person using two identifiers.  Patient is located at home and provider is located at [facility].     The purpose of this virtual visit is to provide medical care while limiting exposure to the novel coronavirus. I discussed the limitations, risks, security and privacy concerns of performing an evaluation and management service by MyChart video and the availability of in person appointments. I also discussed with the patient that there may be a patient responsible charge related to this service. By engaging in this virtual visit, you consent to the provision of healthcare.  Additionally, you authorize for your insurance to be billed for the services provided during this visit.  The patient expressed understanding and agreed to proceed.  The following staff members participated in the virtual visit:  Venora Maples, MD/MPH    PRENATAL VISIT NOTE  Subjective:  Rebecca Friedman is a 25 y.o. G1P0 at [redacted]w[redacted]d  for phone visit for ongoing prenatal care.  She is currently monitored for the following issues for this low-risk pregnancy and has Supervision of other normal pregnancy, antepartum; UTI in pregnancy, antepartum, second trimester; Anemia in pregnancy, second trimester; Alpha thalassemia silent carrier; and [redacted] weeks gestation of pregnancy on their problem list.  Patient reports no complaints.  Contractions: Not present. Vag. Bleeding: None.  Movement: Present. Denies leaking of fluid.   The following portions of the patient's history were reviewed and updated as appropriate: allergies, current medications, past family history, past medical history, past social history, past surgical history and problem list.   Objective:   Vitals:   04/04/20 1000 04/04/20 1132  BP: (!) 145/78 128/80  Pulse: 85   Weight: 184 lb (83.5 kg)    Self-Obtained  Fetal Status:      Movement: Present     Assessment and Plan:  Pregnancy: G1P0 at [redacted]w[redacted]d 1. Alpha thalassemia silent carrier   2. Supervision of other normal pregnancy, antepartum BP initially elevated, normal with proper technique Good fetal movement  3. Anemia in pregnancy, second trimester resolved  4. UTI in pregnancy, antepartum, second trimester TOC neg  5. Elevated blood pressure reading First BP 145/78 but taken on forearm Repeat was 128/80 Denies HA, vision changes, chest pain, SOB, RUQ pain, LE edema In person visit next week to check BP, consider labs at that time if elevated  Term labor symptoms and general obstetric precautions including but not limited to vaginal bleeding, contractions, leaking of fluid and fetal movement were reviewed in detail with the patient.  Return in 1 week (on 04/11/2020) for [facility], ob visit, in person.  No future appointments.   Time spent on virtual visit: 10 minutes  Venora Maples, MD

## 2020-04-04 NOTE — Patient Instructions (Signed)
 Contraception Choices Contraception, also called birth control, refers to methods or devices that prevent pregnancy. Hormonal methods Contraceptive implant A contraceptive implant is a thin, plastic tube that contains a hormone that prevents pregnancy. It is different from an intrauterine device (IUD). It is inserted into the upper part of the arm by a health care provider. Implants can be effective for up to 3 years. Progestin-only injections Progestin-only injections are injections of progestin, a synthetic form of the hormone progesterone. They are given every 3 months by a health care provider. Birth control pills Birth control pills are pills that contain hormones that prevent pregnancy. They must be taken once a day, preferably at the same time each day. A prescription is needed to use this method of contraception. Birth control patch The birth control patch contains hormones that prevent pregnancy. It is placed on the skin and must be changed once a week for three weeks and removed on the fourth week. A prescription is needed to use this method of contraception. Vaginal ring A vaginal ring contains hormones that prevent pregnancy. It is placed in the vagina for three weeks and removed on the fourth week. After that, the process is repeated with a new ring. A prescription is needed to use this method of contraception. Emergency contraceptive Emergency contraceptives prevent pregnancy after unprotected sex. They come in pill form and can be taken up to 5 days after sex. They work best the sooner they are taken after having sex. Most emergency contraceptives are available without a prescription. This method should not be used as your only form of birth control.   Barrier methods Female condom A female condom is a thin sheath that is worn over the penis during sex. Condoms keep sperm from going inside a woman's body. They can be used with a sperm-killing substance (spermicide) to increase their  effectiveness. They should be thrown away after one use. Female condom A female condom is a soft, loose-fitting sheath that is put into the vagina before sex. The condom keeps sperm from going inside a woman's body. They should be thrown away after one use. Diaphragm A diaphragm is a soft, dome-shaped barrier. It is inserted into the vagina before sex, along with a spermicide. The diaphragm blocks sperm from entering the uterus, and the spermicide kills sperm. A diaphragm should be left in the vagina for 6-8 hours after sex and removed within 24 hours. A diaphragm is prescribed and fitted by a health care provider. A diaphragm should be replaced every 1-2 years, after giving birth, after gaining more than 15 lb (6.8 kg), and after pelvic surgery. Cervical cap A cervical cap is a round, soft latex or plastic cup that fits over the cervix. It is inserted into the vagina before sex, along with spermicide. It blocks sperm from entering the uterus. The cap should be left in place for 6-8 hours after sex and removed within 48 hours. A cervical cap must be prescribed and fitted by a health care provider. It should be replaced every 2 years. Sponge A sponge is a soft, circular piece of polyurethane foam with spermicide in it. The sponge helps block sperm from entering the uterus, and the spermicide kills sperm. To use it, you make it wet and then insert it into the vagina. It should be inserted before sex, left in for at least 6 hours after sex, and removed and thrown away within 30 hours. Spermicides Spermicides are chemicals that kill or block sperm from entering the   cervix and uterus. They can come as a cream, jelly, suppository, foam, or tablet. A spermicide should be inserted into the vagina with an applicator at least 10-15 minutes before sex to allow time for it to work. The process must be repeated every time you have sex. Spermicides do not require a prescription.   Intrauterine  contraception Intrauterine device (IUD) An IUD is a T-shaped device that is put in a woman's uterus. There are two types:  Hormone IUD.This type contains progestin, a synthetic form of the hormone progesterone. This type can stay in place for 3-5 years.  Copper IUD.This type is wrapped in copper wire. It can stay in place for 10 years. Permanent methods of contraception Female tubal ligation In this method, a woman's fallopian tubes are sealed, tied, or blocked during surgery to prevent eggs from traveling to the uterus. Hysteroscopic sterilization In this method, a small, flexible insert is placed into each fallopian tube. The inserts cause scar tissue to form in the fallopian tubes and block them, so sperm cannot reach an egg. The procedure takes about 3 months to be effective. Another form of birth control must be used during those 3 months. Female sterilization This is a procedure to tie off the tubes that carry sperm (vasectomy). After the procedure, the man can still ejaculate fluid (semen). Another form of birth control must be used for 3 months after the procedure. Natural planning methods Natural family planning In this method, a couple does not have sex on days when the woman could become pregnant. Calendar method In this method, the woman keeps track of the length of each menstrual cycle, identifies the days when pregnancy can happen, and does not have sex on those days. Ovulation method In this method, a couple avoids sex during ovulation. Symptothermal method This method involves not having sex during ovulation. The woman typically checks for ovulation by watching changes in her temperature and in the consistency of cervical mucus. Post-ovulation method In this method, a couple waits to have sex until after ovulation. Where to find more information  Centers for Disease Control and Prevention: www.cdc.gov Summary  Contraception, also called birth control, refers to methods or  devices that prevent pregnancy.  Hormonal methods of contraception include implants, injections, pills, patches, vaginal rings, and emergency contraceptives.  Barrier methods of contraception can include female condoms, female condoms, diaphragms, cervical caps, sponges, and spermicides.  There are two types of IUDs (intrauterine devices). An IUD can be put in a woman's uterus to prevent pregnancy for 3-5 years.  Permanent sterilization can be done through a procedure for males and females. Natural family planning methods involve nothaving sex on days when the woman could become pregnant. This information is not intended to replace advice given to you by your health care provider. Make sure you discuss any questions you have with your health care provider. Document Revised: 07/02/2019 Document Reviewed: 07/02/2019 Elsevier Patient Education  2021 Else[employer] Inc.   Breastfeeding  Choosing to breastfeed is one of the best decisions you can make for yourself and your baby. A change in hormones during pregnancy causes your breasts to make breast milk in your milk-producing glands. Hormones prevent breast milk from being released before your baby is born. They also prompt milk flow after birth. Once breastfeeding has begun, thoughts of your baby, as well as his or her sucking or crying, can stimulate the release of milk from your milk-producing glands. Benefits of breastfeeding Research shows that breastfeeding offers many health benefits   for infants and mothers. It also offers a cost-free and convenient way to feed your baby. For your baby  Your first milk (colostrum) helps your baby's digestive system to function better.  Special cells in your milk (antibodies) help your baby to fight off infections.  Breastfed babies are less likely to develop asthma, allergies, obesity, or type 2 diabetes. They are also at lower risk for sudden infant death syndrome (SIDS).  Nutrients in breast milk are better  able to meet your baby's needs compared to infant formula.  Breast milk improves your baby's brain development. For you  Breastfeeding helps to create a very special bond between you and your baby.  Breastfeeding is convenient. Breast milk costs nothing and is always available at the correct temperature.  Breastfeeding helps to burn calories. It helps you to lose the weight that you gained during pregnancy.  Breastfeeding makes your uterus return faster to its size before pregnancy. It also slows bleeding (lochia) after you give birth.  Breastfeeding helps to lower your risk of developing type 2 diabetes, osteoporosis, rheumatoid arthritis, cardiovascular disease, and breast, ovarian, uterine, and endometrial cancer later in life. Breastfeeding basics Starting breastfeeding  Find a comfortable place to sit or lie down, with your neck and back well-supported.  Place a pillow or a rolled-up blanket under your baby to bring him or her to the level of your breast (if you are seated). Nursing pillows are specially designed to help support your arms and your baby while you breastfeed.  Make sure that your baby's tummy (abdomen) is facing your abdomen.  Gently massage your breast. With your fingertips, massage from the outer edges of your breast inward toward the nipple. This encourages milk flow. If your milk flows slowly, you may need to continue this action during the feeding.  Support your breast with 4 fingers underneath and your thumb above your nipple (make the letter "C" with your hand). Make sure your fingers are well away from your nipple and your baby's mouth.  Stroke your baby's lips gently with your finger or nipple.  When your baby's mouth is open wide enough, quickly bring your baby to your breast, placing your entire nipple and as much of the areola as possible into your baby's mouth. The areola is the colored area around your nipple. ? More areola should be visible above your  baby's upper lip than below the lower lip. ? Your baby's lips should be opened and extended outward (flanged) to ensure an adequate, comfortable latch. ? Your baby's tongue should be between his or her lower gum and your breast.  Make sure that your baby's mouth is correctly positioned around your nipple (latched). Your baby's lips should create a seal on your breast and be turned out (everted).  It is common for your baby to suck about 2-3 minutes in order to start the flow of breast milk. Latching Teaching your baby how to latch onto your breast properly is very important. An improper latch can cause nipple pain, decreased milk supply, and poor weight gain in your baby. Also, if your baby is not latched onto your nipple properly, he or she may swallow some air during feeding. This can make your baby fussy. Burping your baby when you switch breasts during the feeding can help to get rid of the air. However, teaching your baby to latch on properly is still the best way to prevent fussiness from swallowing air while breastfeeding. Signs that your baby has successfully latched onto   your nipple  Silent tugging or silent sucking, without causing you pain. Infant's lips should be extended outward (flanged).  Swallowing heard between every 3-4 sucks once your milk has started to flow (after your let-down milk reflex occurs).  Muscle movement above and in front of his or her ears while sucking. Signs that your baby has not successfully latched onto your nipple  Sucking sounds or smacking sounds from your baby while breastfeeding.  Nipple pain. If you think your baby has not latched on correctly, slip your finger into the corner of your baby's mouth to break the suction and place it between your baby's gums. Attempt to start breastfeeding again. Signs of successful breastfeeding Signs from your baby  Your baby will gradually decrease the number of sucks or will completely stop sucking.  Your baby  will fall asleep.  Your baby's body will relax.  Your baby will retain a small amount of milk in his or her mouth.  Your baby will let go of your breast by himself or herself. Signs from you  Breasts that have increased in firmness, weight, and size 1-3 hours after feeding.  Breasts that are softer immediately after breastfeeding.  Increased milk volume, as well as a change in milk consistency and color by the fifth day of breastfeeding.  Nipples that are not sore, cracked, or bleeding. Signs that your baby is getting enough milk  Wetting at least 1-2 diapers during the first 24 hours after birth.  Wetting at least 5-6 diapers every 24 hours for the first week after birth. The urine should be clear or pale yellow by the age of 5 days.  Wetting 6-8 diapers every 24 hours as your baby continues to grow and develop.  At least 3 stools in a 24-hour period by the age of 5 days. The stool should be soft and yellow.  At least 3 stools in a 24-hour period by the age of 7 days. The stool should be seedy and yellow.  No loss of weight greater than 10% of birth weight during the first 3 days of life.  Average weight gain of 4-7 oz (113-198 g) per week after the age of 4 days.  Consistent daily weight gain by the age of 5 days, without weight loss after the age of 2 weeks. After a feeding, your baby may spit up a small amount of milk. This is normal. Breastfeeding frequency and duration Frequent feeding will help you make more milk and can prevent sore nipples and extremely full breasts (breast engorgement). Breastfeed when you feel the need to reduce the fullness of your breasts or when your baby shows signs of hunger. This is called "breastfeeding on demand." Signs that your baby is hungry include:  Increased alertness, activity, or restlessness.  Movement of the head from side to side.  Opening of the mouth when the corner of the mouth or cheek is stroked (rooting).  Increased  sucking sounds, smacking lips, cooing, sighing, or squeaking.  Hand-to-mouth movements and sucking on fingers or hands.  Fussing or crying. Avoid introducing a pacifier to your baby in the first 4-6 weeks after your baby is born. After this time, you may choose to use a pacifier. Research has shown that pacifier use during the first year of a baby's life decreases the risk of sudden infant death syndrome (SIDS). Allow your baby to feed on each breast as long as he or she wants. When your baby unlatches or falls asleep while feeding from the   first breast, offer the second breast. Because newborns are often sleepy in the first few weeks of life, you may need to awaken your baby to get him or her to feed. Breastfeeding times will vary from baby to baby. However, the following rules can serve as a guide to help you make sure that your baby is properly fed:  Newborns (babies 4 weeks of age or younger) may breastfeed every 1-3 hours.  Newborns should not go without breastfeeding for longer than 3 hours during the day or 5 hours during the night.  You should breastfeed your baby a minimum of 8 times in a 24-hour period. Breast milk pumping Pumping and storing breast milk allows you to make sure that your baby is exclusively fed your breast milk, even at times when you are unable to breastfeed. This is especially important if you go back to work while you are still breastfeeding, or if you are not able to be present during feedings. Your lactation consultant can help you find a method of pumping that works best for you and give you guidelines about how long it is safe to store breast milk.      Caring for your breasts while you breastfeed Nipples can become dry, cracked, and sore while breastfeeding. The following recommendations can help keep your breasts moisturized and healthy:  Avoid using soap on your nipples.  Wear a supportive bra designed especially for nursing. Avoid wearing underwire-style  bras or extremely tight bras (sports bras).  Air-dry your nipples for 3-4 minutes after each feeding.  Use only cotton bra pads to absorb leaked breast milk. Leaking of breast milk between feedings is normal.  Use lanolin on your nipples after breastfeeding. Lanolin helps to maintain your skin's normal moisture barrier. Pure lanolin is not harmful (not toxic) to your baby. You may also hand express a few drops of breast milk and gently massage that milk into your nipples and allow the milk to air-dry. In the first few weeks after giving birth, some women experience breast engorgement. Engorgement can make your breasts feel heavy, warm, and tender to the touch. Engorgement peaks within 3-5 days after you give birth. The following recommendations can help to ease engorgement:  Completely empty your breasts while breastfeeding or pumping. You may want to start by applying warm, moist heat (in the shower or with warm, water-soaked hand towels) just before feeding or pumping. This increases circulation and helps the milk flow. If your baby does not completely empty your breasts while breastfeeding, pump any extra milk after he or she is finished.  Apply ice packs to your breasts immediately after breastfeeding or pumping, unless this is too uncomfortable for you. To do this: ? Put ice in a plastic bag. ? Place a towel between your skin and the bag. ? Leave the ice on for 20 minutes, 2-3 times a day.  Make sure that your baby is latched on and positioned properly while breastfeeding. If engorgement persists after 48 hours of following these recommendations, contact your health care provider or a lactation consultant. Overall health care recommendations while breastfeeding  Eat 3 healthy meals and 3 snacks every day. Well-nourished mothers who are breastfeeding need an additional 450-500 calories a day. You can meet this requirement by increasing the amount of a balanced diet that you eat.  Drink  enough water to keep your urine pale yellow or clear.  Rest often, relax, and continue to take your prenatal vitamins to prevent fatigue, stress, and low   vitamin and mineral levels in your body (nutrient deficiencies).  Do not use any products that contain nicotine or tobacco, such as cigarettes and e-cigarettes. Your baby may be harmed by chemicals from cigarettes that pass into breast milk and exposure to secondhand smoke. If you need help quitting, ask your health care provider.  Avoid alcohol.  Do not use illegal drugs or marijuana.  Talk with your health care provider before taking any medicines. These include over-the-counter and prescription medicines as well as vitamins and herbal supplements. Some medicines that may be harmful to your baby can pass through breast milk.  It is possible to become pregnant while breastfeeding. If birth control is desired, ask your health care provider about options that will be safe while breastfeeding your baby. Where to find more information: [employer]: www.llli.org Contact a health care provider if:  You feel like you want to stop breastfeeding or have become frustrated with breastfeeding.  Your nipples are cracked or bleeding.  Your breasts are red, tender, or warm.  You have: ? Painful breasts or nipples. ? A swollen area on either breast. ? A fever or chills. ? Nausea or vomiting. ? Drainage other than breast milk from your nipples.  Your breasts do not become full before feedings by the fifth day after you give birth.  You feel sad and depressed.  Your baby is: ? Too sleepy to eat well. ? Having trouble sleeping. ? More than 1 week old and wetting fewer than 6 diapers in a 24-hour period. ? Not gaining weight by 5 days of age.  Your baby has fewer than 3 stools in a 24-hour period.  Your baby's skin or the white parts of his or her eyes become yellow. Get help right away if:  Your baby is overly tired  (lethargic) and does not want to wake up and feed.  Your baby develops an unexplained fever. Summary  Breastfeeding offers many health benefits for infant and mothers.  Try to breastfeed your infant when he or she shows early signs of hunger.  Gently tickle or stroke your baby's lips with your finger or nipple to allow the baby to open his or her mouth. Bring the baby to your breast. Make sure that much of the areola is in your baby's mouth. Offer one side and burp the baby before you offer the other side.  Talk with your health care provider or lactation consultant if you have questions or you face problems as you breastfeed. This information is not intended to replace advice given to you by your health care provider. Make sure you discuss any questions you have with your health care provider. Document Revised: 04/21/2017 Document Reviewed: 02/27/2016 Elsevier Patient Education  2021 [employer] Inc.  

## 2020-04-10 ENCOUNTER — Inpatient Hospital Stay (HOSPITAL_COMMUNITY)
Admission: AD | Admit: 2020-04-10 | Discharge: 2020-04-12 | DRG: 807 | Disposition: A | Discharge status: Home / Self Care | Payer: Medicaid Other | Attending: Obstetrics & Gynecology | Admitting: Obstetrics & Gynecology

## 2020-04-10 ENCOUNTER — Encounter (HOSPITAL_COMMUNITY): Payer: Self-pay | Admitting: Obstetrics & Gynecology

## 2020-04-10 ENCOUNTER — Other Ambulatory Visit: Payer: Self-pay

## 2020-04-10 DIAGNOSIS — Z3A38 38 weeks gestation of pregnancy: Secondary | ICD-10-CM | POA: Diagnosis not present

## 2020-04-10 DIAGNOSIS — Z3480 Encounter for supervision of other normal pregnancy, unspecified trimester: Secondary | ICD-10-CM

## 2020-04-10 DIAGNOSIS — O133 Gestational [pregnancy-induced] hypertension without significant proteinuria, third trimester: Secondary | ICD-10-CM | POA: Diagnosis present

## 2020-04-10 DIAGNOSIS — D563 Thalassemia minor: Secondary | ICD-10-CM | POA: Diagnosis not present

## 2020-04-10 DIAGNOSIS — O99820 Streptococcus B carrier state complicating pregnancy: Secondary | ICD-10-CM | POA: Diagnosis not present

## 2020-04-10 DIAGNOSIS — O9902 Anemia complicating childbirth: Secondary | ICD-10-CM | POA: Diagnosis not present

## 2020-04-10 DIAGNOSIS — O99824 Streptococcus B carrier state complicating childbirth: Secondary | ICD-10-CM | POA: Diagnosis present

## 2020-04-10 DIAGNOSIS — Z20822 Contact with and (suspected) exposure to covid-19: Secondary | ICD-10-CM | POA: Diagnosis not present

## 2020-04-10 DIAGNOSIS — O134 Gestational [pregnancy-induced] hypertension without significant proteinuria, complicating childbirth: Principal | ICD-10-CM | POA: Diagnosis present

## 2020-04-10 DIAGNOSIS — Z23 Encounter for immunization: Secondary | ICD-10-CM

## 2020-04-10 DIAGNOSIS — D649 Anemia, unspecified: Secondary | ICD-10-CM | POA: Diagnosis not present

## 2020-04-10 LAB — COMPREHENSIVE METABOLIC PANEL
ALT: 10 U/L (ref 0–44)
AST: 22 U/L (ref 15–41)
Albumin: 3.3 g/dL — ABNORMAL LOW (ref 3.5–5.0)
Alkaline Phosphatase: 148 U/L — ABNORMAL HIGH (ref 38–126)
Anion gap: 12 (ref 5–15)
BUN: 6 mg/dL (ref 6–20)
CO2: 20 mmol/L — ABNORMAL LOW (ref 22–32)
Calcium: 9.3 mg/dL (ref 8.9–10.3)
Chloride: 103 mmol/L (ref 98–111)
Creatinine, Ser: 0.72 mg/dL (ref 0.44–1.00)
GFR, Estimated: >60 mL/min (ref >60)
Glucose, Bld: 80 mg/dL (ref 70–99)
Potassium: 3.9 mmol/L (ref 3.5–5.1)
Sodium: 135 mmol/L (ref 135–145)
Total Bilirubin: 0.6 mg/dL (ref 0.3–1.2)
Total Protein: 7.4 g/dL (ref 6.5–8.1)

## 2020-04-10 LAB — CBC
HCT: 39.6 % (ref 36.0–46.0)
Hemoglobin: 12.1 g/dL (ref 12.0–15.0)
MCH: 23.7 pg — ABNORMAL LOW (ref 26.0–34.0)
MCHC: 30.6 g/dL (ref 30.0–36.0)
MCV: 77.5 fL — ABNORMAL LOW (ref 80.0–100.0)
Platelets: 205 10*3/uL (ref 150–400)
RBC: 5.11 MIL/uL (ref 3.87–5.11)
RDW: 14.8 % (ref 11.5–15.5)
WBC: 11.7 10*3/uL — ABNORMAL HIGH (ref 4.0–10.5)
nRBC: 0 % (ref 0.0–0.2)

## 2020-04-10 LAB — RESP PANEL BY RT-PCR (FLU A&B, COVID) ARPGX2
Influenza A by PCR: NEGATIVE
Influenza B by PCR: NEGATIVE
SARS Coronavirus 2 by RT PCR: NEGATIVE

## 2020-04-10 LAB — PROTEIN / CREATININE RATIO, URINE
Creatinine, Urine: 104.2 mg/dL
Protein Creatinine Ratio: 0.12 mg/mg{Cre} (ref 0.00–0.15)
Total Protein, Urine: 12 mg/dL

## 2020-04-10 LAB — TYPE AND SCREEN
ABO/RH(D): O POS
Antibody Screen: NEGATIVE

## 2020-04-10 MED ORDER — ACETAMINOPHEN 325 MG PO TABS: 650.0000 mg | ORAL_TABLET | ORAL | Status: DC | PRN

## 2020-04-10 MED ORDER — FENTANYL CITRATE (PF) 100 MCG/2ML IJ SOLN
50.0000 ug | INTRAMUSCULAR | Status: DC | PRN
  Administered 2020-04-11: 50 ug via INTRAVENOUS
  Filled 2020-04-10: qty 2

## 2020-04-10 MED ORDER — SOD CITRATE-CITRIC ACID 500-334 MG/5ML PO SOLN: 30.0000 mL | ORAL | Status: DC | PRN

## 2020-04-10 MED ORDER — FLEET ENEMA 7-19 GM/118ML RE ENEM: 1.0000 | ENEMA | RECTAL | Status: DC | PRN

## 2020-04-10 MED ORDER — OXYTOCIN-SODIUM CHLORIDE 30-0.9 UT/500ML-% IV SOLN
2.5000 [IU]/h | INTRAVENOUS | Status: DC
  Administered 2020-04-11: 2.5 [IU]/h via INTRAVENOUS

## 2020-04-10 MED ORDER — LIDOCAINE HCL (PF) 1 % IJ SOLN: 30.0000 mL | INTRAMUSCULAR | Status: DC | PRN

## 2020-04-10 MED ORDER — OXYTOCIN BOLUS FROM INFUSION
333.0000 mL | Freq: Once | INTRAVENOUS | Status: AC
  Administered 2020-04-11: 333 mL via INTRAVENOUS

## 2020-04-10 MED ORDER — LACTATED RINGERS IV SOLN: INTRAVENOUS | Status: DC

## 2020-04-10 MED ORDER — SODIUM CHLORIDE 0.9 % IV SOLN
5.0000 10*6.[IU] | Freq: Once | INTRAVENOUS | Status: AC
  Administered 2020-04-10: 5 10*6.[IU] via INTRAVENOUS
  Filled 2020-04-10: qty 5

## 2020-04-10 MED ORDER — LACTATED RINGERS IV SOLN: 500.0000 mL | INTRAVENOUS | Status: DC | PRN

## 2020-04-10 MED ORDER — ONDANSETRON HCL 4 MG/2ML IJ SOLN: 4.0000 mg | Freq: Four times a day (QID) | INTRAMUSCULAR | Status: DC | PRN

## 2020-04-10 MED ORDER — OXYCODONE-ACETAMINOPHEN 5-325 MG PO TABS: 1.0000 | ORAL_TABLET | ORAL | Status: DC | PRN

## 2020-04-10 MED ORDER — PENICILLIN G POT IN DEXTROSE 60000 UNIT/ML IV SOLN
3.0000 10*6.[IU] | INTRAVENOUS | Status: DC
  Administered 2020-04-11: 3 10*6.[IU] via INTRAVENOUS
  Filled 2020-04-10: qty 50

## 2020-04-10 MED ORDER — OXYCODONE-ACETAMINOPHEN 5-325 MG PO TABS: 2.0000 | ORAL_TABLET | ORAL | Status: DC | PRN

## 2020-04-10 NOTE — H&P (Cosign Needed)
Rebecca Friedman is a 25 y.o. G1P0 female with IUP at 62w2dby LMP presenting for early labor w/probable augmentation for gHTN. She reports +Fms and denies VB, blurry vision, headaches, peripheral edema, and RUQ pain.  She plans on breast feeding and undecided on birth control at this time.   Dating: by LMP ---> Estimated date of delivery 04/22/20  Sono: _0 , EFW 430 gm, 97%  Prenatal care: [facility]   Prenatal history/complications: - Gestational HTN  - GBS positive  - Alpha thalassemia silent carrier   OB History    Gravida  1   Para      Term      Preterm      AB      Living        SAB      IAB      Ectopic      Multiple      Live Births             History reviewed. No pertinent past medical history. Past Surgical History:  Procedure Laterality Date  . CHALAZION EXCISION Left 08/15/2012   Procedure: EXCISION CHALAZION UPPER LID LEFT EYE;  Surgeon: TMyrtha Mantis, MD;  Location: [facility]  Service: Ophthalmology;  Laterality: Left;   Family History: family history is not on file. Social History:  reports that she has never smoked. She has never used smokeless tobacco. She reports that she does not drink alcohol and does not use drugs.     Maternal Diabetes: No Genetic Screening: Normal Maternal Ultrasounds/Referrals: Isolated EIF (echogenic intracardiac focus) Fetal Ultrasounds or other Referrals:  None Maternal Substance Abuse:  No Significant Maternal Medications:  None Significant Maternal Lab Results:  Group B Strep positive Other Comments:  None  Review of Systems   All systems reviewed and negative except as stated in HPI.  General appearance: alert, cooperative and appears stated age Lungs: normal WOB Heart: regular rate  Abdomen: soft, non-tender Extremities: no sign of DVT Presentation: cephalic Fetal monitoring: Baseline: 140 bmp, Variability: moderate, Accelerations: reactive, decelerations: absent, CXT: 1 q  2-5 minutes    Dilation: 3.5 Effacement (%): 80 Station: -2 Exam by:: ATherisa Doyne RN Blood pressure (!) 141/81, pulse 89, temperature 98.6 F (37 C), temperature source Oral, resp. rate 18, height _1  (1.727 m), weight 79.8 kg, last menstrual period 07/17/2019, SpO2 100 %. Exam Physical Exam  Prenatal labs: ABO, Rh: --/--/PENDING (03/03 2006) Antibody: PENDING (03/03 2006) Rubella: 5.47 (09/29 1402) RPR: Non Reactive (12/29 1055)  HBsAg: Negative (09/29 1402)  HIV: Non Reactive (12/29 1055)  GBS: Positive/-- (02/18 0945)   Assessment/Plan: OBethanny Toelleis a 25year old GG62P0female at 331w2dresenting for early labor augmentation for gHTN.  #Labor: Progressing well. Will consider initiating Pitocyin if inadequate contractions.  #Pain: Patient desires epidural   #FWB: Category 1 strip #ID: GBS positive #MOF: Breast  #MOC: Undecided   - Gestational HTN: Multiple elevated blood pressures noted with no severe range pressures. Most recent at 141/81. Pre-eclampsia labs pending. Denies HA, blurry vision or RUQ pain. Will continue to monitor.   - GBS positive: PCN G given at 2048.   - Anemia in pregnancy: Resolved. Most recent hgb noted at 12.1. Will continue to monitor with consideration of PO iron post-partum.   - Alpha Thalassemia silent carrier   Raejonna L Pascarella, PA-S 04/10/2020, 8:42 PM  I personally saw and evaluated the patient, performing the key elements of the service. I developed and verified  the management plan that is described in the resident's/student's note, and I agree with the content with my edits above. VSS, HRR&R, Resp unlabored, Legs neg.  Nigel Berthold, CNM 04/11/2020 12:17 AM

## 2020-04-10 NOTE — MAU Note (Signed)
Ctxs for 2 days. Denies VB or discharge. Does not know dilation from last sve.

## 2020-04-11 ENCOUNTER — Inpatient Hospital Stay (HOSPITAL_COMMUNITY): Payer: Medicaid Other | Admitting: Anesthesiology

## 2020-04-11 ENCOUNTER — Encounter (HOSPITAL_COMMUNITY): Payer: Self-pay | Admitting: Obstetrics & Gynecology

## 2020-04-11 DIAGNOSIS — Z3A38 38 weeks gestation of pregnancy: Secondary | ICD-10-CM | POA: Diagnosis not present

## 2020-04-11 DIAGNOSIS — O9902 Anemia complicating childbirth: Secondary | ICD-10-CM | POA: Diagnosis not present

## 2020-04-11 DIAGNOSIS — O134 Gestational [pregnancy-induced] hypertension without significant proteinuria, complicating childbirth: Secondary | ICD-10-CM | POA: Diagnosis not present

## 2020-04-11 DIAGNOSIS — D649 Anemia, unspecified: Secondary | ICD-10-CM | POA: Diagnosis not present

## 2020-04-11 DIAGNOSIS — O99820 Streptococcus B carrier state complicating pregnancy: Secondary | ICD-10-CM | POA: Diagnosis not present

## 2020-04-11 LAB — RPR: RPR Ser Ql: NONREACTIVE

## 2020-04-11 MED ORDER — DIPHENHYDRAMINE HCL 50 MG/ML IJ SOLN: 12.5000 mg | INTRAMUSCULAR | Status: DC | PRN

## 2020-04-11 MED ORDER — PHENYLEPHRINE 40 MCG/ML (10ML) SYRINGE FOR IV PUSH (FOR BLOOD PRESSURE SUPPORT)
80.0000 ug | PREFILLED_SYRINGE | INTRAVENOUS | Status: DC | PRN
  Filled 2020-04-11: qty 10

## 2020-04-11 MED ORDER — DIBUCAINE (PERIANAL) 1 % EX OINT: 1.0000 "application " | TOPICAL_OINTMENT | CUTANEOUS | Status: DC | PRN

## 2020-04-11 MED ORDER — DIPHENHYDRAMINE HCL 25 MG PO CAPS
25.0000 mg | ORAL_CAPSULE | Freq: Four times a day (QID) | ORAL | Status: DC | PRN
  Administered 2020-04-11: 25 mg via ORAL
  Filled 2020-04-11: qty 1

## 2020-04-11 MED ORDER — EPHEDRINE 5 MG/ML INJ: 10.0000 mg | INTRAVENOUS | Status: DC | PRN

## 2020-04-11 MED ORDER — TERBUTALINE SULFATE 1 MG/ML IJ SOLN: 0.2500 mg | Freq: Once | INTRAMUSCULAR | Status: DC | PRN

## 2020-04-11 MED ORDER — ONDANSETRON HCL 4 MG/2ML IJ SOLN: 4.0000 mg | INTRAMUSCULAR | Status: DC | PRN

## 2020-04-11 MED ORDER — ACETAMINOPHEN 325 MG PO TABS
650.0000 mg | ORAL_TABLET | Freq: Four times a day (QID) | ORAL | Status: DC
  Administered 2020-04-11 – 2020-04-12 (×5): 650 mg via ORAL
  Filled 2020-04-11 (×5): qty 2

## 2020-04-11 MED ORDER — WITCH HAZEL-GLYCERIN EX PADS: 1.0000 "application " | MEDICATED_PAD | CUTANEOUS | Status: DC | PRN

## 2020-04-11 MED ORDER — FENTANYL-BUPIVACAINE-NACL 0.5-0.125-0.9 MG/250ML-% EP SOLN
12.0000 mL/h | EPIDURAL | Status: DC | PRN
  Administered 2020-04-11: 12 mL/h via EPIDURAL
  Filled 2020-04-11: qty 250

## 2020-04-11 MED ORDER — SIMETHICONE 80 MG PO CHEW: 80.0000 mg | CHEWABLE_TABLET | ORAL | Status: DC | PRN

## 2020-04-11 MED ORDER — COCONUT OIL OIL: 1.0000 "application " | TOPICAL_OIL | Status: DC | PRN

## 2020-04-11 MED ORDER — PRENATAL MULTIVITAMIN CH
1.0000 | ORAL_TABLET | Freq: Every day | ORAL | Status: DC
  Administered 2020-04-11 – 2020-04-12 (×2): 1 via ORAL
  Filled 2020-04-11 (×2): qty 1

## 2020-04-11 MED ORDER — SENNOSIDES-DOCUSATE SODIUM 8.6-50 MG PO TABS
2.0000 | ORAL_TABLET | Freq: Every day | ORAL | Status: DC
  Administered 2020-04-12: 2 via ORAL
  Filled 2020-04-11: qty 2

## 2020-04-11 MED ORDER — IBUPROFEN 600 MG PO TABS
600.0000 mg | ORAL_TABLET | Freq: Four times a day (QID) | ORAL | Status: DC
  Administered 2020-04-11 – 2020-04-12 (×4): 600 mg via ORAL
  Filled 2020-04-11 (×5): qty 1

## 2020-04-11 MED ORDER — PHENYLEPHRINE 40 MCG/ML (10ML) SYRINGE FOR IV PUSH (FOR BLOOD PRESSURE SUPPORT): 80.0000 ug | PREFILLED_SYRINGE | INTRAVENOUS | Status: DC | PRN

## 2020-04-11 MED ORDER — ZOLPIDEM TARTRATE 5 MG PO TABS: 5.0000 mg | ORAL_TABLET | Freq: Every evening | ORAL | Status: DC | PRN

## 2020-04-11 MED ORDER — OXYTOCIN-SODIUM CHLORIDE 30-0.9 UT/500ML-% IV SOLN
1.0000 m[IU]/min | INTRAVENOUS | Status: DC
  Administered 2020-04-11: 2 m[IU]/min via INTRAVENOUS
  Filled 2020-04-11: qty 500

## 2020-04-11 MED ORDER — TETANUS-DIPHTH-ACELL PERTUSSIS 5-2.5-18.5 LF-MCG/0.5 IM SUSY
0.5000 mL | PREFILLED_SYRINGE | Freq: Once | INTRAMUSCULAR | Status: AC
  Administered 2020-04-12: 0.5 mL via INTRAMUSCULAR
  Filled 2020-04-11: qty 0.5

## 2020-04-11 MED ORDER — ONDANSETRON HCL 4 MG PO TABS: 4.0000 mg | ORAL_TABLET | ORAL | Status: DC | PRN

## 2020-04-11 MED ORDER — LIDOCAINE HCL (PF) 1 % IJ SOLN
INTRAMUSCULAR | Status: DC | PRN
  Administered 2020-04-11: 3 mL via EPIDURAL
  Administered 2020-04-11: 7 mL via EPIDURAL

## 2020-04-11 MED ORDER — BENZOCAINE-MENTHOL 20-0.5 % EX AERO: 1.0000 "application " | INHALATION_SPRAY | CUTANEOUS | Status: DC | PRN

## 2020-04-11 MED ORDER — LACTATED RINGERS IV SOLN: 500.0000 mL | Freq: Once | INTRAVENOUS | Status: DC

## 2020-04-11 NOTE — Progress Notes (Signed)
Patient Vitals for the past 4 hrs:  BP Temp Temp src Pulse Resp SpO2  04/10/20 2308 133/71 -- -- 74 -- --  04/10/20 2208 (!) 135/91 -- -- 79 -- --  04/10/20 2206 (!) 149/94 -- -- 89 -- --  04/10/20 2140 127/85 -- -- 80 -- --  04/10/20 2030 (!) 141/81 98.6 F (37 C) Oral 89 18 100 %   Ctx have spaced out some, cx still 4.5/90/-2. FHR Cat 1.  Will augment with pitocin.

## 2020-04-11 NOTE — Discharge Summary (Cosign Needed Addendum)
Postpartum Discharge Summary     Patient Name: Rebecca Friedman DOB: 1996/02/06 MRN: [id number]  Date of admission: 04/10/2020 Delivery date:04/11/2020  Delivering provider: Christin Fudge  Date of discharge: 04/12/2020  Admitting diagnosis: Normal labor and delivery [O80] Gestational hypertension, third trimester [O13.3] Intrauterine pregnancy: [redacted]w[redacted]d    Secondary diagnosis:  Active Problems:   Normal labor and delivery   Gestational hypertension, third trimester  Additional problems: none    Discharge diagnosis: Term Pregnancy Delivered                                              Post partum procedures:NA Augmentation: AROM and Pitocin Complications: None  Hospital course: Onset of Labor With Vaginal Delivery      25y.o. yo G1P0 at 330w3das admitted in Latent Labor on 04/10/2020. Patient had an uncomplicated labor course as follows:  Membrane Rupture Time/Date: 4:35 AM ,04/11/2020   Delivery Method:Vaginal, Spontaneous  Episiotomy: None  Lacerations:  2nd degree  Patient had an uncomplicated postpartum course.  She is ambulating, tolerating a regular diet, passing flatus, and urinating well. Patient is discharged home in stable condition on 04/12/20.  Newborn Data: Birth date:04/11/2020  Birth time:4:48 AM  Gender:Female  Living status:Living  Apgars:9 ,9  Weight:2960 g   Magnesium Sulfate received: No BMZ received: No Rhophylac:N/A MMR:N/A T-DaP:declined prenatally Flu: N/A Transfusion:No  Physical exam  Vitals:   04/11/20 1206 04/11/20 2004 04/12/20 0030 04/12/20 0441  BP: 118/74 122/86 119/82 122/87  Pulse: 80 86 86 88  Resp: '18 19 18 17  ' Temp:  98.5 F (36.9 C) 97.8 F (36.6 C) 98.1 F (36.7 C)  TempSrc:  Oral Oral Oral  SpO2:  100% 100% 100%  Weight:      Height:       General: alert, cooperative and no distress Lochia: appropriate Uterine Fundus: firm Incision: N/A DVT Evaluation: No evidence of DVT seen on physical exam. Labs: Lab  Results  Component Value Date   WBC 11.7 (H) 04/10/2020   HGB 12.1 04/10/2020   HCT 39.6 04/10/2020   MCV 77.5 (L) 04/10/2020   PLT 205 04/10/2020   CMP Latest Ref Rng & Units 04/10/2020  Glucose 70 - 99 mg/dL 80  BUN 6 - 20 mg/dL 6  Creatinine 0.44 - 1.00 mg/dL 0.72  Sodium 135 - 145 mmol/L 135  Potassium 3.5 - 5.1 mmol/L 3.9  Chloride 98 - 111 mmol/L 103  CO2 22 - 32 mmol/L 20(L)  Calcium 8.9 - 10.3 mg/dL 9.3  Total Protein 6.5 - 8.1 g/dL 7.4  Total Bilirubin 0.3 - 1.2 mg/dL 0.6  Alkaline Phos 38 - 126 U/L 148(H)  AST 15 - 41 U/L 22  ALT 0 - 44 U/L 10   Edinburgh Score: Edinburgh Postnatal Depression Scale Screening Tool 04/11/2020  I have been able to laugh and see the funny side of things. (No Data)     After visit meds:  Allergies as of 04/12/2020   No Known Allergies     Medication List    TAKE these medications   acetaminophen 325 MG tablet Commonly known as: TYLENOL Take 650 mg by mouth every 6 (six) hours as needed for mild pain or headache.   Blood Pressure Kit Devi 1 kit by Does not apply route as needed.   CoBuckhornupp Sm Misc Wear as  directed.   ferrous sulfate 325 (65 FE) MG tablet Take 1 tablet (325 mg total) by mouth 2 (two) times daily with a meal.   Prenate Pixie 10-0.6-0.4-200 MG Caps Take 1 capsule by mouth daily.        Discharge home in stable condition Infant Feeding: Breast Infant Disposition:home with mother Discharge instruction: per After Visit Summary and Postpartum booklet. Activity: Advance as tolerated. Pelvic rest for 6 weeks.  Diet: routine diet Future Appointments:No future appointments. Follow up Visit:   Please schedule this patient for a In person postpartum visit in 4 weeks with the following provider: Any provider. Additional Postpartum F/U: bp check 1 week Low risk pregnancy complicated by: HTN Delivery mode:  Vaginal, Spontaneous  Anticipated Birth Control:  Unsure   04/12/2020 Gifford Shave,  MD  I saw and evaluated the patient. I agree with the findings and the plan of care as documented in the resident's note.  Sharene Skeans, MD [facility] Fellow, [facility] for [facility], [place]

## 2020-04-11 NOTE — Anesthesia Procedure Notes (Signed)
Epidural Patient location during procedure: OB Start time: 04/11/2020 2:47 AM End time: 04/11/2020 2:59 AM  Staffing Anesthesiologist: Lucretia Kern, MD Performed: anesthesiologist   Preanesthetic Checklist Completed: patient identified, IV checked, risks and benefits discussed, monitors and equipment checked, pre-op evaluation and timeout performed  Epidural Patient position: sitting Prep: DuraPrep Patient monitoring: heart rate, continuous pulse ox and blood pressure Approach: midline Location: L3-L4 Injection technique: LOR air  Needle:  Needle type: Tuohy  Needle gauge: 17 G Needle length: 9 cm Needle insertion depth: 5 cm Catheter type: closed end flexible Catheter size: 19 Gauge Catheter at skin depth: 10 cm Test dose: negative  Assessment Events: blood not aspirated, injection not painful, no injection resistance, no paresthesia and negative IV test  Additional Notes Reason for block:procedure for pain

## 2020-04-11 NOTE — Anesthesia Postprocedure Evaluation (Signed)
Anesthesia Post Note  Patient: Sahvanna Hevia  Procedure(s) Performed: AN AD HOC LABOR EPIDURAL     Patient location during evaluation: Mother Baby Anesthesia Type: Epidural Level of consciousness: awake and alert and oriented Pain management: satisfactory to patient Vital Signs Assessment: post-procedure vital signs reviewed and stable Respiratory status: respiratory function stable Cardiovascular status: stable Postop Assessment: no headache, no backache, epidural receding, patient able to bend at knees, no signs of nausea or vomiting, adequate PO intake and able to ambulate Anesthetic complications: no   No complications documented.  Last Vitals:  Vitals:   04/11/20 0659 04/11/20 0800  BP: 128/82 126/87  Pulse: 68 72  Resp: 18 18  Temp: 37 C 37 C  SpO2: 100% 100%    Last Pain:  Vitals:   04/11/20 1206  TempSrc:   PainSc: 0-No pain   Pain Goal: Patients Stated Pain Goal: 0 (04/10/20 1914)                 Karleen Dolphin

## 2020-04-11 NOTE — Lactation Note (Signed)
This note was copied from a baby's chart. Lactation Consultation Note  Patient Name: Rebecca Friedman Date: 04/11/2020 Reason for consult: Follow-up assessment;Mother's request;Difficult latch;1st time breastfeeding;Primapara;Early term 37-38.6wks Age: 24 hrs Mom stated feeding short 2-5 minutes. Infant adequate urine and stool. Mom hand expression and stored 3 ml of EBM in the fridge.  LC noted Mom's latch shallow. LC did some suck training with infant to get a deeper latch in football. Infant fed for 22 minutes with signs of milk transfer. Mom set up on manual pump to increase stimulation q 3 hrs for 10 minutes.  Plan 1. To feed based on cues 8-12x in 24 hrs period, no more than 4 hrs without an attempt. Mom to offer both breasts, STS and look for signs of skin transfer.          2 Mom offer EBM via spoon or finger feeding to extend feedings and offer more.           3. Manual pump as described above          4 I and O sheet reviewed           5 LC brochure of inpatient and outpatient services reviewed.  All questions answered at the end of the visit.    Maternal Data Has patient been taught Hand Expression?: Yes Does the patient have breastfeeding experience prior to this delivery?: No  Feeding Mother's Current Feeding Choice: Breast Milk  LATCH Score Latch: Grasps breast easily, tongue down, lips flanged, rhythmical sucking.  Audible Swallowing: A few with stimulation  Type of Nipple: Everted at rest and after stimulation  Comfort (Breast/Nipple): Soft / non-tender  Hold (Positioning): Assistance needed to correctly position infant at breast and maintain latch.  LATCH Score: 8   Lactation Tools Discussed/Used Tools: Flanges;Pump Flange Size: 24 Breast pump type: Manual Pump Education: Setup, frequency, and cleaning;Milk Storage Reason for Pumping: increase stimulation Pumping frequency: every 3 hrs for 15 minutes  Interventions Interventions: Breast  feeding basics reviewed;Breast compression;Assisted with latch;Adjust position;Hand pump;Skin to skin;Support pillows;Breast massage;Position options;Hand express;Expressed milk;Education  Discharge Pump: Manual WIC Program: Yes  Consult Status Consult Status: Follow-up Date: 04/12/20 Follow-up type: In-patient    Crystal  Nicholson-Springer 04/11/2020, 6:05 PM

## 2020-04-11 NOTE — Anesthesia Preprocedure Evaluation (Signed)
Anesthesia Evaluation  Patient identified by MRN, date of birth, ID band Patient awake    Reviewed: Allergy & Precautions, H&P , NPO status , Patient's Chart, lab work & pertinent test results  History of Anesthesia Complications Negative for: history of anesthetic complications  Airway Mallampati: II  TM Distance: >3 FB Neck ROM: full    Dental no notable dental hx.    Pulmonary neg pulmonary ROS,    Pulmonary exam normal        Cardiovascular hypertension (gestational), Normal cardiovascular exam Rhythm:regular Rate:Normal     Neuro/Psych negative neurological ROS  negative psych ROS   GI/Hepatic negative GI ROS, Neg liver ROS,   Endo/Other  negative endocrine ROS  Renal/GU negative Renal ROS  negative genitourinary   Musculoskeletal   Abdominal   Peds  Hematology negative hematology ROS (+)   Anesthesia Other Findings   Reproductive/Obstetrics (+) Pregnancy                             Anesthesia Physical Anesthesia Plan  ASA: II  Anesthesia Plan: Epidural   Post-op Pain Management:    Induction:   PONV Risk Score and Plan:   Airway Management Planned:   Additional Equipment:   Intra-op Plan:   Post-operative Plan:   Informed Consent: I have reviewed the patients History and Physical, chart, labs and discussed the procedure including the risks, benefits and alternatives for the proposed anesthesia with the patient or authorized representative who has indicated his/her understanding and acceptance.       Plan Discussed with:   Anesthesia Plan Comments:         Anesthesia Quick Evaluation  

## 2020-04-11 NOTE — Progress Notes (Signed)
Rebecca Friedman is a 25 y.o. G1P0 at [redacted]w[redacted]d admitted for early labor and new gHTN.  Subjective: Strip reviewed.  Getting epidural.   Objective: BP 129/78   Pulse 80   Temp 99.6 F (37.6 C) (Oral)   Resp 18   Ht 5\' 8"  (1.727 m)   Wt 79.8 kg   LMP 07/17/2019   SpO2 100%   BMI 26.76 kg/m  No intake/output data recorded.  FHT: 125/moderate/+accels/no decels UC: q2-4  SVE:   Dilation: 6 Effacement (%): 90 Station: 0 Exam by:: [email or web address], RN  Pitocin @ 8 mu/min  Labs: Lab Results  Component Value Date   WBC 11.7 (H) 04/10/2020   HGB 12.1 04/10/2020   HCT 39.6 04/10/2020   MCV 77.5 (L) 04/10/2020   PLT 205 04/10/2020    Assessment / Plan: Rebecca Friedman is a 25 y.o. G1P0 at [redacted]w[redacted]d admitted for early labor and new gHTN.    #Labor: Progressing well. Will consider initiating Pitocyin if inadequate contractions.  #Pain: Patient desires epidural   #FWB: Category 1 strip #ID: GBS positive, PNC #MOF: Breast  #MOC: Undecided  #Gestational HTN: Multiple elevated blood pressures noted with no severe range pressures. Pre-E labs negative.  #Anemia in pregnancy: Resolved.  Admission Hgb 12.1. #Alpha Thalassemia silent carrier   [redacted]w[redacted]d, MD 04/11/2020, 2:53 AM

## 2020-04-12 NOTE — Lactation Note (Signed)
This note was copied from a baby's chart. Lactation Consultation Note  Patient Name: Rebecca Friedman [id number]'V Date: 04/12/2020   Age:24 hours   P1 mother whose infant is now 60 hours old.  This is an ETI at 38+3 weeks.    Baby was asleep when I arrived.  Mother had no immediate questions/concerns related to breast feeding.  Reviewed how to obtain a deep latch and mother feels like baby is latching better now; initially she was latched with a shallow latch.  Reinforced the importance of breast massage and hand expression before/after feeding to help ensure a good milk supply.  Mother required review of her manual pump.  Instructions given.    Mother will continue to feed 8-12 times/24 hours or sooner if baby shows cues.  Offered to return for a latch assist/observation as needed.  Suggested mother use her EBM/coconut oil for comfort.  Nipples are intact.  Provided coconut oil with instructions.   Father and grandmother present.  Mother has private insurance.  Informed her on how she can obtain a DEBP from her insurance company.  Mother appreciative.   Maternal Data    Feeding    LATCH Score                    Lactation Tools Discussed/Used    Interventions    Discharge    Consult Status Consult Status: Follow-up Date: 04/13/20 Follow-up type: In-patient    Tennis Mckinnon R Swade Shonka 04/12/2020, 10:36 AM

## 2020-04-21 ENCOUNTER — Ambulatory Visit: Payer: Medicaid Other

## 2020-04-21 DIAGNOSIS — Z0130 Encounter for examination of blood pressure without abnormal findings: Secondary | ICD-10-CM

## 2020-05-12 ENCOUNTER — Ambulatory Visit: Payer: Medicaid Other | Admitting: Advanced Practice Midwife

## 2020-05-15 ENCOUNTER — Other Ambulatory Visit: Payer: Self-pay

## 2020-05-15 ENCOUNTER — Ambulatory Visit (INDEPENDENT_AMBULATORY_CARE_PROVIDER_SITE_OTHER): Payer: Medicaid Other | Admitting: Obstetrics

## 2020-05-15 ENCOUNTER — Encounter: Payer: Self-pay | Admitting: Obstetrics

## 2020-05-15 DIAGNOSIS — Z3009 Encounter for other general counseling and advice on contraception: Secondary | ICD-10-CM | POA: Diagnosis not present

## 2020-05-15 NOTE — Progress Notes (Signed)
Post Partum Visit Note  Rebecca Friedman is a 25 y.o. G19P1001 female who presents for a postpartum visit. She is 4 weeks postpartum following a normal spontaneous vaginal delivery.  I have fully reviewed the prenatal and intrapartum course. The delivery was at 38 gestational weeks.  Anesthesia: epidural. Postpartum course has been Unremarkable. Baby is doing well. Baby is feeding by breast. Bleeding no bleeding. Bowel function is normal. Bladder function is normal. Patient is not sexually active. Contraception method is none. Postpartum depression screening: negative.EPDS= 0    The pregnancy intention screening data noted above was reviewed. Potential methods of contraception were discussed. The patient elected to proceed with No Method - Other Reason.    Edinburgh Postnatal Depression Scale - 05/15/20 1525      Edinburgh Postnatal Depression Scale:  In the Past 7 Days   I have been able to laugh and see the funny side of things. 0    I have looked forward with enjoyment to things. 0    I have blamed myself unnecessarily when things went wrong. 0    I have been anxious or worried for no good reason. 0    I have felt scared or panicky for no good reason. 0    Things have been getting on top of me. 0    I have been so unhappy that I have had difficulty sleeping. 0    I have felt sad or miserable. 0    I have been so unhappy that I have been crying. 0    The thought of harming myself has occurred to me. 0    Edinburgh Postnatal Depression Scale Total 0            The following portions of the patient's history were reviewed and updated as appropriate: allergies, current medications, past family history, past medical history, past social history, past surgical history and problem list.  Review of Systems A comprehensive review of systems was negative.  Objective:  BP 126/78   Pulse 83   Wt 157 lb (71.2 kg)   BMI 23.87 kg/m    General:  alert and no distress   Breasts:   inspection negative, no nipple discharge or bleeding, no masses or nodularity palpable  Lungs: clear to auscultation bilaterally  Heart:  regular rate and rhythm, S1, S2 normal, no murmur, click, rub or gallop  Abdomen: soft, non-tender; bowel sounds normal; no masses,  no organomegaly    Assessment:    1. Postpartum care following vaginal delivery - doing well  2. Mother currently breast-feeding - plans to breast feed for ~ 6 months  3. Encounter for counseling regarding contraception - declines contraception.   - Micronor discussed as an option for contraception along with other progestin-only methods  Plan:   Essential components of care per ACOG recommendations:  1.  Mood and well being: Patient with negative depression screening today. Reviewed local resources for support.  - Patient does not use tobacco.  - hx of drug use? No    2. Infant care and feeding:  -Patient currently breastmilk feeding? Yes If breastmilk feeding discussed return to work and pumping. If needed, patient was provided letter for work to allow for every 2-3 hr pumping breaks, and to be granted a private location to express breastmilk and refrigerated area to store breastmilk. Reviewed importance of draining breast regularly to support lactation. -Social determinants of health (SDOH) reviewed in EPIC. No concerns  3. Sexuality, contraception and birth spacing -  Patient does not want a pregnancy in the next year.  Desired family size is 3 children.  - Reviewed forms of contraception in tiered fashion. Patient desired no method today.   - Discussed birth spacing of 18 months  4. Sleep and fatigue -Encouraged family/partner/community support of 4 hrs of uninterrupted sleep to help with mood and fatigue  5. Physical Recovery  - Discussed patients delivery - Patient had a 2nd degree laceration, perineal healing reviewed. Patient expressed understanding - Patient has urinary incontinence? No - Patient is  not safe to resume physical and sexual activity  6.  Health Maintenance - Last pap smear done 11/07/19 and was normal with negative HPV.  7. No Chronic Disease   Coral Ceo, MD [facility], [facility] 05/15/20

## 2020-06-09 ENCOUNTER — Ambulatory Visit: Payer: Medicaid Other | Admitting: Obstetrics and Gynecology

## 2022-03-01 IMAGING — US US MFM OB COMP +14 WKS
1 series · 13 of 28 positions shown · non-contrast
Comparison: none

[Series 1: us mfm ob comp +14 wks · 13 of 104 slices shown]
[im 4/104]
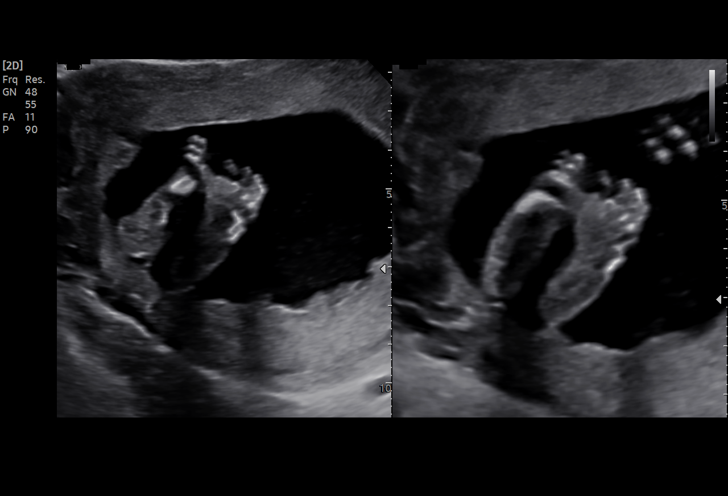
[im 12/104]
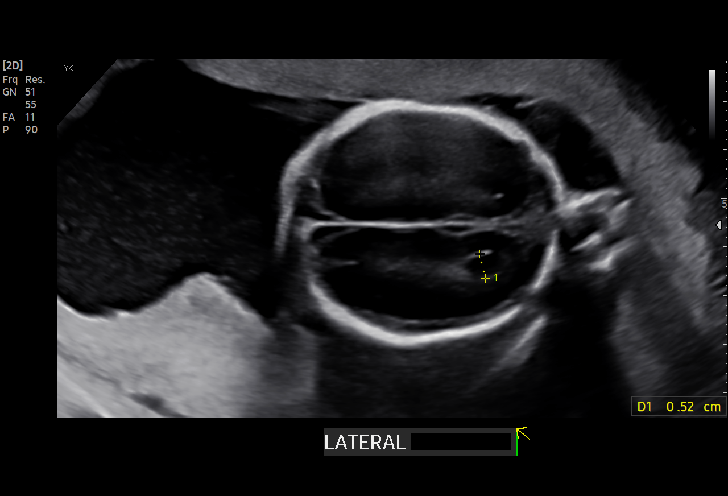
[im 20/104]
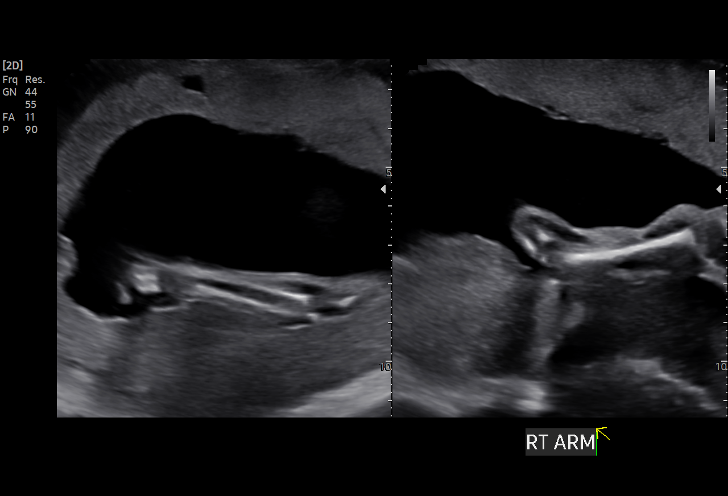
[im 27/104]
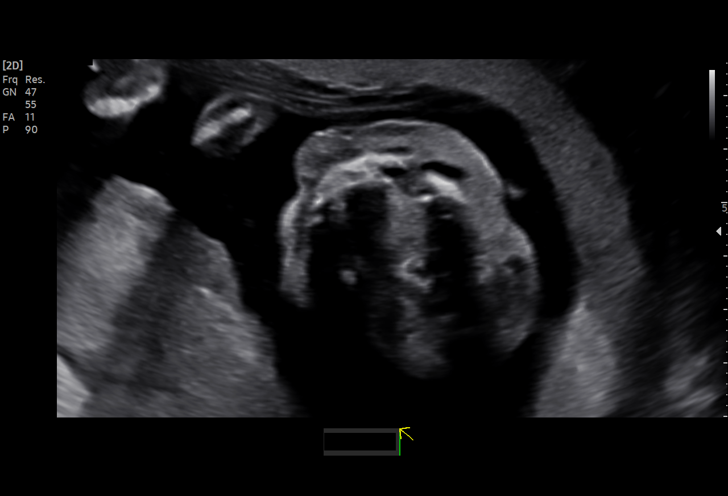
[im 35/104]
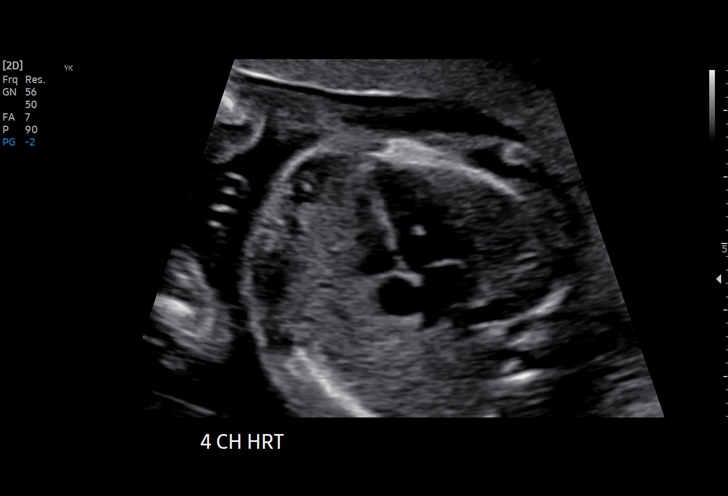
[im 42/104]
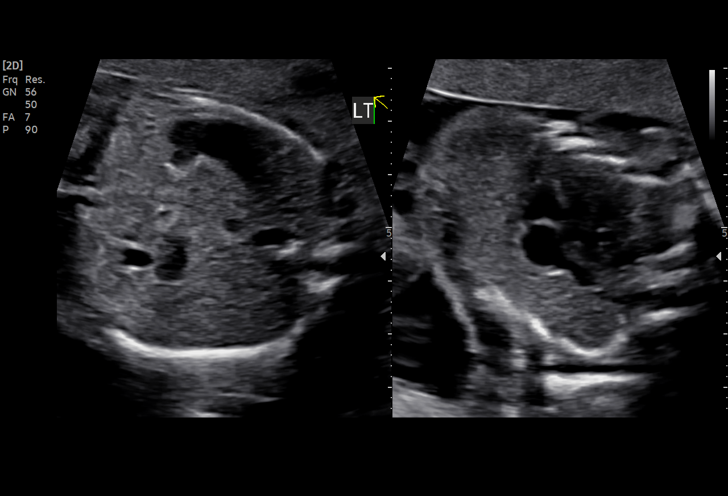
[im 54/104]
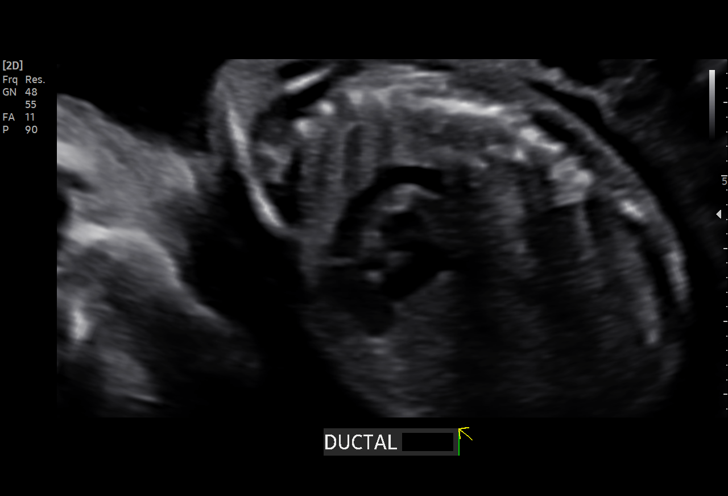
[im 62/104]
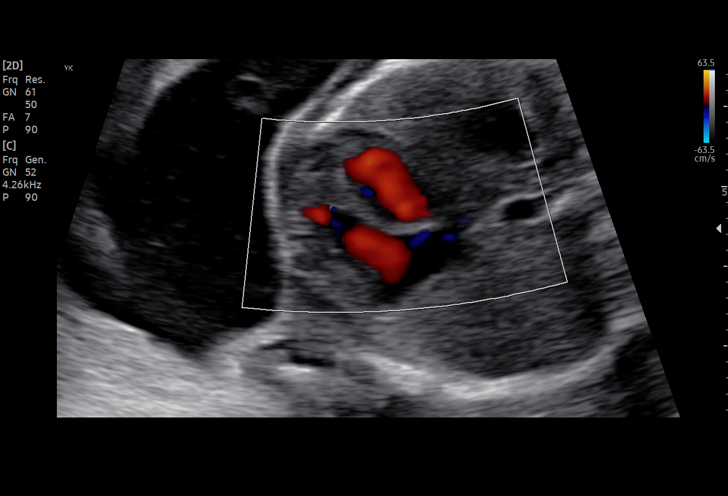
[im 69/104]
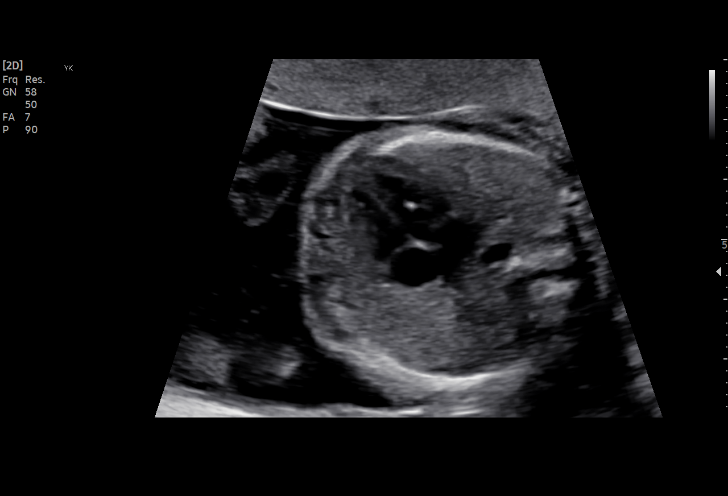
[im 77/104]
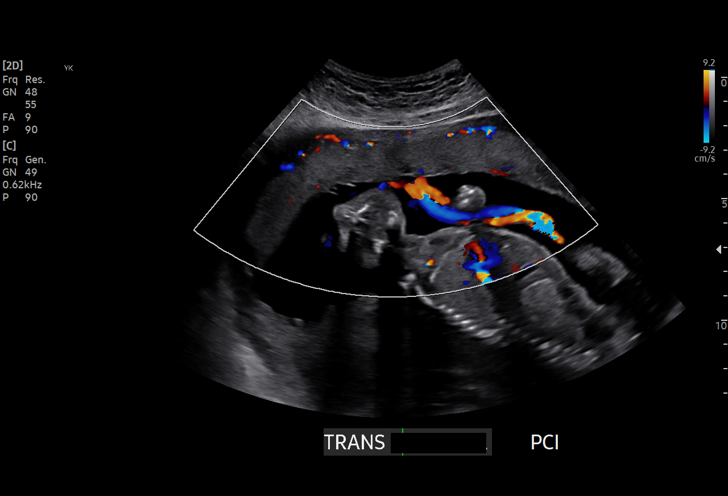
[im 84/104]
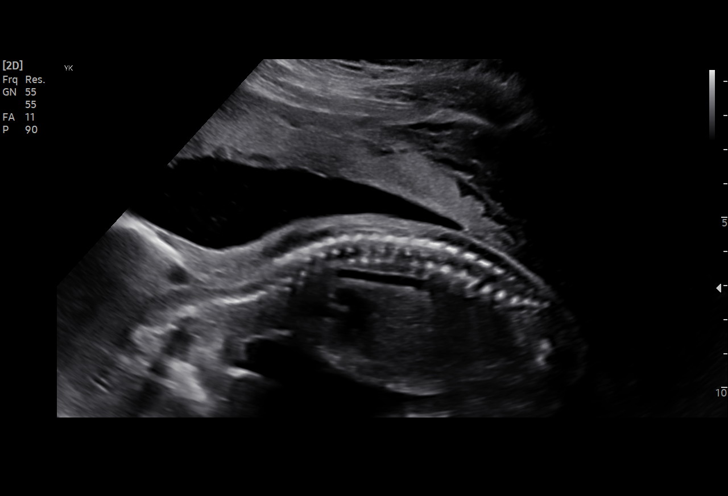
[im 92/104]
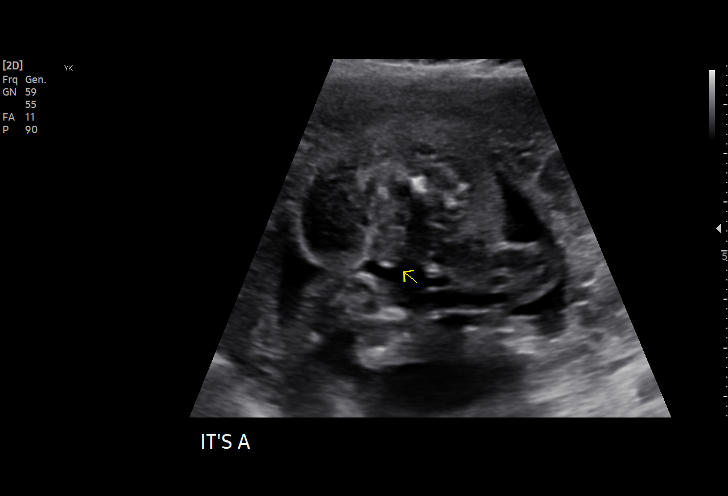
[im 100/104]
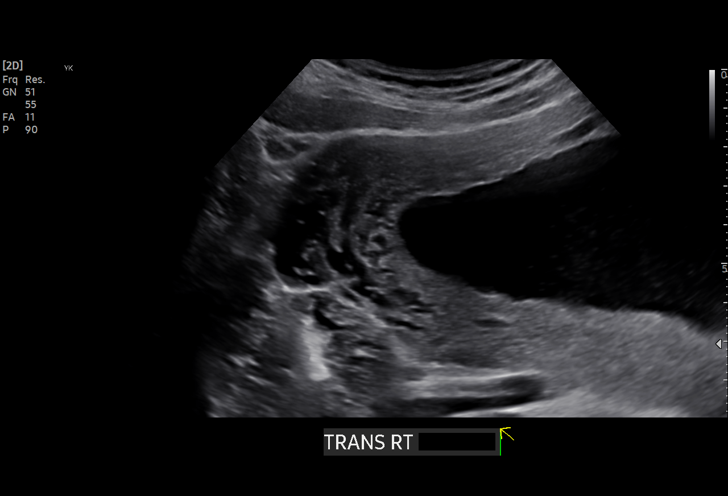

[13 of 28 positions shown; findings below may reference images not displayed]

1  US MFM OB COMP + 14 WK                76805.01    MACHO TIGER

Indications

 Anemia during pregnancy in second trimester
 Encounter for antenatal screening for
 malformations
 Genetic s: Carrier for A-Thalassemia,
 NIPS:LR, Female, FF:7.3 %, AFP: Pending
 20 weeks gestation of pregnancy
 Echogenic intracardiac focus of the heart
 (EIF)
Fetal Evaluation

 Num Of Fetuses:         1
 Fetal Heart Rate(bpm):  145
 Cardiac Activity:       Observed
 Presentation:           Breech
 Placenta:               Anterior
 P. Cord Insertion:      Visualized, central

 Amniotic Fluid
 AFI FV:      Within normal limits

                             Largest Pocket(cm)

Biometry

 BPD:      50.1  mm     G. Age:  21w 1d         83  %    CI:         72.4   %    70 - 86
                                                         FL/HC:      18.3   %    16.8 -
 HC:      187.3  mm     G. Age:  21w 0d         76  %    HC/AC:      1.08        1.09 -
 AC:      173.3  mm     G. Age:  22w 2d         94  %    FL/BPD:     68.3   %
 FL:       34.2  mm     G. Age:  20w 5d         59  %    FL/AC:      19.7   %    20 - 24
 CER:      22.2  mm     G. Age:  20w 6d         85  %
 NFT:       3.4  mm
 CM:        3.3  mm
 Est. FW:     430  gm    0 lb 15 oz      97  %
OB History

 Gravidity:    1
Gestational Age

 LMP:           20w 2d        Date:  07/17/19                 EDD:   04/22/20
 U/S Today:     21w 2d                                        EDD:   04/15/20
 Best:          20w 2d     Det. By:  LMP  (07/17/19)          EDD:   04/22/20
Anatomy

 Cranium:               Appears normal         LVOT:                   Appears normal
 Cavum:                 Appears normal         Aortic Arch:            Appears normal
 Ventricles:            Appears normal         Ductal Arch:            Appears normal
 Choroid Plexus:        Appears normal         Diaphragm:              Appears normal
 Cerebellum:            Appears normal         Stomach:                Appears normal, left
                                                                       sided
 Posterior Fossa:       Appears normal         Abdomen:                Appears normal
 Nuchal Fold:           Appears normal         Abdominal Wall:         Appears nml (cord
                                                                       insert, abd wall)
 Face:                  Appears normal         Cord Vessels:           Appears normal (3
                        (orbits and profile)                           vessel cord)
 Lips:                  Appears normal         Kidneys:                Appear normal
 Palate:                Appears normal         Bladder:                Appears normal
 Thoracic:              Appears normal         Spine:                  Appears normal
 Heart:                 Appears normal; EIF    Upper Extremities:      Appears normal
 RVOT:                  Appears normal         Lower Extremities:      Appears normal

 Other:  Hands and feet visualized. Open hands visualized. Fetus appears to
         be female. Nasal bone visualized.
Cervix Uterus Adnexa

 Cervix
 Length:              3  cm.
 Normal appearance by transabdominal scan.

 Uterus
 No abnormality visualized.

 Right Ovary
 No adnexal mass visualized.

 Left Ovary
 No adnexal mass visualized.

 Cul De Sac
 No free fluid seen.

 Adnexa
 No abnormality visualized.
Impression

 G1 P0. Patient is here for fetal anatomy scan.
 On cell-free fetal DNA screening, the risks of fetal
 aneuploidies are not increased .
 She is a carrier for alpha thalassemia (a-/a-).

 We performed fetal anatomy scan. An echogenic intracardiac
 focus is seen. No other makers of aneuploidies or fetal
 structural defects are seen. Fetal biometry is consistent with
 her previously-established dates. Amniotic fluid is normal and
 good fetal activity is seen.
 I informed the patient that given that she had Kendell Simonton for fetal
 aneuploidies on cell-free fetal DNA screening, finding of
 echogenic intracardiac focus should be considered a normal
 variant and that the risk of trisomy 21 is not increased. I also
 reassured that echogenic focus does not increase the risk of
 cardiac defects. I also informed her that only amniocentesis
 will give a defintive result on the fetal karyotype.
 Patient opted not to have amniocentesis.

 I briefly discussed partner screening for alpha thalassemia
 and genetics behind transmission. I recommended that the
 couple meet with our genetic counselor to discuss alpha
 thalassemia carrier status and partner screening/prenatal
 diagnosis.

 Patient informed that she will call to make an appointment if
 she decides to meet with our genetic counselor.
Recommendations

 Follow-up scans as clinically indicated.
                 Gatta, Dan Viorel
